# Patient Record
Sex: Female | Born: 1940 | Race: Black or African American | Hispanic: No | Smoking: Former smoker
Health system: Southern US, Community
[De-identification: ages and names within clinical notes are randomized; demographics above are authoritative.]

## PROBLEM LIST (undated history)

## (undated) DIAGNOSIS — T7840XA Allergy, unspecified, initial encounter: Secondary | ICD-10-CM

## (undated) DIAGNOSIS — I10 Essential (primary) hypertension: Secondary | ICD-10-CM

## (undated) DIAGNOSIS — E119 Type 2 diabetes mellitus without complications: Secondary | ICD-10-CM

## (undated) DIAGNOSIS — R32 Unspecified urinary incontinence: Secondary | ICD-10-CM

## (undated) DIAGNOSIS — E7800 Pure hypercholesterolemia, unspecified: Secondary | ICD-10-CM

## (undated) HISTORY — PX: CHOLECYSTECTOMY: SHX55

## (undated) HISTORY — PX: TUBAL LIGATION: SHX77

## (undated) HISTORY — DX: Allergy, unspecified, initial encounter: T78.40XA

## (undated) HISTORY — DX: Unspecified urinary incontinence: R32

---

## 2000-03-06 ENCOUNTER — Other Ambulatory Visit: Admission: RE | Admit: 2000-03-06 | Discharge: 2000-03-06 | Payer: Self-pay | Admitting: Family Medicine

## 2005-02-21 ENCOUNTER — Emergency Department (HOSPITAL_COMMUNITY): Admission: EM | Admit: 2005-02-21 | Discharge: 2005-02-21 | Payer: Self-pay | Admitting: Emergency Medicine

## 2005-08-28 ENCOUNTER — Encounter: Admission: RE | Admit: 2005-08-28 | Discharge: 2005-08-28 | Payer: Self-pay | Admitting: Cardiology

## 2005-09-03 ENCOUNTER — Ambulatory Visit (HOSPITAL_COMMUNITY): Admission: RE | Admit: 2005-09-03 | Discharge: 2005-09-03 | Payer: Self-pay | Admitting: Cardiology

## 2005-09-11 ENCOUNTER — Ambulatory Visit (HOSPITAL_COMMUNITY): Admission: RE | Admit: 2005-09-11 | Discharge: 2005-09-11 | Payer: Self-pay | Admitting: Cardiology

## 2005-09-23 ENCOUNTER — Encounter: Admission: RE | Admit: 2005-09-23 | Discharge: 2005-09-23 | Payer: Self-pay | Admitting: *Deleted

## 2005-09-25 ENCOUNTER — Encounter: Admission: RE | Admit: 2005-09-25 | Discharge: 2005-09-28 | Payer: Self-pay | Admitting: Cardiology

## 2008-01-07 ENCOUNTER — Emergency Department (HOSPITAL_COMMUNITY): Admission: EM | Admit: 2008-01-07 | Discharge: 2008-01-07 | Payer: Self-pay | Admitting: Family Medicine

## 2008-06-26 ENCOUNTER — Encounter: Admission: RE | Admit: 2008-06-26 | Discharge: 2008-06-26 | Payer: Self-pay | Admitting: Cardiology

## 2008-06-27 ENCOUNTER — Encounter: Admission: RE | Admit: 2008-06-27 | Discharge: 2008-06-27 | Payer: Self-pay | Admitting: Cardiology

## 2008-07-11 ENCOUNTER — Encounter: Admission: RE | Admit: 2008-07-11 | Discharge: 2008-07-11 | Payer: Self-pay | Admitting: Cardiology

## 2009-07-20 ENCOUNTER — Encounter: Admission: RE | Admit: 2009-07-20 | Discharge: 2009-07-20 | Payer: Self-pay | Admitting: Internal Medicine

## 2011-03-31 ENCOUNTER — Other Ambulatory Visit: Payer: Self-pay | Admitting: Internal Medicine

## 2011-03-31 DIAGNOSIS — Z1231 Encounter for screening mammogram for malignant neoplasm of breast: Secondary | ICD-10-CM

## 2011-04-01 ENCOUNTER — Emergency Department (HOSPITAL_COMMUNITY): Payer: Medicare Other

## 2011-04-01 ENCOUNTER — Inpatient Hospital Stay (HOSPITAL_COMMUNITY)
Admission: EM | Admit: 2011-04-01 | Discharge: 2011-04-04 | DRG: 287 | Disposition: A | Discharge status: Home / Self Care | Payer: Medicare Other | Attending: Internal Medicine | Admitting: Internal Medicine

## 2011-04-01 DIAGNOSIS — Z87891 Personal history of nicotine dependence: Secondary | ICD-10-CM

## 2011-04-01 DIAGNOSIS — K209 Esophagitis, unspecified without bleeding: Secondary | ICD-10-CM | POA: Diagnosis present

## 2011-04-01 DIAGNOSIS — K449 Diaphragmatic hernia without obstruction or gangrene: Secondary | ICD-10-CM | POA: Diagnosis present

## 2011-04-01 DIAGNOSIS — E785 Hyperlipidemia, unspecified: Secondary | ICD-10-CM | POA: Diagnosis present

## 2011-04-01 DIAGNOSIS — E119 Type 2 diabetes mellitus without complications: Secondary | ICD-10-CM | POA: Diagnosis present

## 2011-04-01 DIAGNOSIS — I10 Essential (primary) hypertension: Secondary | ICD-10-CM | POA: Diagnosis present

## 2011-04-01 DIAGNOSIS — Z7982 Long term (current) use of aspirin: Secondary | ICD-10-CM

## 2011-04-01 DIAGNOSIS — E669 Obesity, unspecified: Secondary | ICD-10-CM | POA: Diagnosis present

## 2011-04-01 DIAGNOSIS — Z882 Allergy status to sulfonamides status: Secondary | ICD-10-CM

## 2011-04-01 DIAGNOSIS — E7800 Pure hypercholesterolemia, unspecified: Secondary | ICD-10-CM | POA: Diagnosis present

## 2011-04-01 DIAGNOSIS — Z880 Allergy status to penicillin: Secondary | ICD-10-CM

## 2011-04-01 DIAGNOSIS — R0789 Other chest pain: Principal | ICD-10-CM | POA: Diagnosis present

## 2011-04-01 DIAGNOSIS — Z79899 Other long term (current) drug therapy: Secondary | ICD-10-CM

## 2011-04-01 LAB — COMPREHENSIVE METABOLIC PANEL
ALT: 13 U/L (ref 0–35)
AST: 17 U/L (ref 0–37)
Alkaline Phosphatase: 59 U/L (ref 39–117)
BUN: 11 mg/dL (ref 6–23)
CO2: 25 mEq/L (ref 19–32)
Calcium: 9.6 mg/dL (ref 8.4–10.5)
Chloride: 103 mEq/L (ref 96–112)
GFR calc Af Amer: >60 mL/min (ref >60)
GFR calc non Af Amer: >60 mL/min (ref >60)
Glucose, Bld: 143 mg/dL — ABNORMAL HIGH (ref 70–99)
Sodium: 139 mEq/L (ref 135–145)
Total Protein: 7.2 g/dL (ref 6.0–8.3)

## 2011-04-01 LAB — CBC
HCT: 36.1 % (ref 36.0–46.0)
Hemoglobin: 13 g/dL (ref 12.0–15.0)
MCH: 26.2 pg (ref 26.0–34.0)
MCHC: 36 g/dL (ref 30.0–36.0)
MCV: 72.8 fL — ABNORMAL LOW (ref 78.0–100.0)
Platelets: 226 10*3/uL (ref 150–400)
RBC: 4.96 MIL/uL (ref 3.87–5.11)
RDW: 14.3 % (ref 11.5–15.5)
WBC: 8.7 10*3/uL (ref 4.0–10.5)

## 2011-04-01 LAB — CK TOTAL AND CKMB (NOT AT ARMC)
CK, MB: 2.3 ng/mL (ref 0.3–4.0)
Total CK: 115 U/L (ref 7–177)

## 2011-04-01 LAB — DIFFERENTIAL
Basophils Absolute: 0.1 10*3/uL (ref 0.0–0.1)
Basophils Relative: 1 % (ref 0–1)
Eosinophils Absolute: 0.2 10*3/uL (ref 0.0–0.7)
Lymphs Abs: 2.5 10*3/uL (ref 0.7–4.0)
Monocytes Absolute: 0.5 10*3/uL (ref 0.1–1.0)
Monocytes Relative: 6 % (ref 3–12)
Neutro Abs: 5.4 10*3/uL (ref 1.7–7.7)

## 2011-04-01 LAB — GLUCOSE, CAPILLARY: Glucose-Capillary: 115 mg/dL — ABNORMAL HIGH (ref 70–99)

## 2011-04-01 LAB — CARDIAC PANEL(CRET KIN+CKTOT+MB+TROPI)
CK, MB: 2.2 ng/mL (ref 0.3–4.0)
Troponin I: <0.3 ng/mL (ref <0.30)

## 2011-04-01 LAB — TROPONIN I: Troponin I: <0.3 ng/mL (ref <0.30)

## 2011-04-01 LAB — PRO B NATRIURETIC PEPTIDE: Pro B Natriuretic peptide (BNP): 43.6 pg/mL (ref 0–125)

## 2011-04-01 LAB — D-DIMER, QUANTITATIVE: D-Dimer, Quant: 0.37 ug/mL-FEU (ref 0.00–0.48)

## 2011-04-02 LAB — LIPID PANEL
LDL Cholesterol: 219 mg/dL — ABNORMAL HIGH (ref 0–99)
Total CHOL/HDL Ratio: 7.7 RATIO
Triglycerides: 180 mg/dL — ABNORMAL HIGH (ref <150)
VLDL: 36 mg/dL (ref 0–40)

## 2011-04-02 LAB — CARDIAC PANEL(CRET KIN+CKTOT+MB+TROPI)
CK, MB: 2.2 ng/mL (ref 0.3–4.0)
Relative Index: INVALID (ref 0.0–2.5)
Total CK: 95 U/L (ref 7–177)
Troponin I: <0.3 ng/mL (ref <0.30)

## 2011-04-02 LAB — TSH: TSH: 2.337 u[IU]/mL (ref 0.350–4.500)

## 2011-04-02 LAB — GLUCOSE, CAPILLARY: Glucose-Capillary: 122 mg/dL — ABNORMAL HIGH (ref 70–99)

## 2011-04-03 ENCOUNTER — Inpatient Hospital Stay (HOSPITAL_COMMUNITY): Payer: Medicare Other

## 2011-04-03 LAB — PROTIME-INR
INR: 0.97 (ref 0.00–1.49)
Prothrombin Time: 13.1 seconds (ref 11.6–15.2)

## 2011-04-03 LAB — GLUCOSE, CAPILLARY
Glucose-Capillary: 100 mg/dL — ABNORMAL HIGH (ref 70–99)
Glucose-Capillary: 100 mg/dL — ABNORMAL HIGH (ref 70–99)

## 2011-04-04 ENCOUNTER — Other Ambulatory Visit (HOSPITAL_COMMUNITY): Payer: Medicare Other

## 2011-04-04 LAB — CBC
MCH: 25.2 pg — ABNORMAL LOW (ref 26.0–34.0)
MCV: 73.1 fL — ABNORMAL LOW (ref 78.0–100.0)
Platelets: 201 10*3/uL (ref 150–400)
RBC: 4.68 MIL/uL (ref 3.87–5.11)
RDW: 14.4 % (ref 11.5–15.5)

## 2011-04-04 LAB — BASIC METABOLIC PANEL
BUN: 11 mg/dL (ref 6–23)
Calcium: 8.8 mg/dL (ref 8.4–10.5)
Chloride: 107 mEq/L (ref 96–112)
Creatinine, Ser: 0.8 mg/dL (ref 0.50–1.10)
GFR calc Af Amer: >60 mL/min (ref >60)
GFR calc non Af Amer: >60 mL/min (ref >60)

## 2011-04-04 LAB — GLUCOSE, CAPILLARY
Glucose-Capillary: 106 mg/dL — ABNORMAL HIGH (ref 70–99)
Glucose-Capillary: 151 mg/dL — ABNORMAL HIGH (ref 70–99)

## 2011-04-10 NOTE — H&P (Signed)
[name], [name]             ACCOUNT NO.:  [id number]  MEDICAL RECORD NO.:  [id number]  LOCATION:  [facility]                         FACILITY:  [facility]  PHYSICIAN:  Jonny Ruiz, MD    DATE OF BIRTH:  09-13-1941  DATE OF ADMISSION:  04/01/2011 DATE OF DISCHARGE:                             HISTORY & PHYSICAL   CHIEF COMPLAINT:  Chest pain.  HISTORY OF PRESENT ILLNESS:  The patient is a 71 year old black female with a past medical history significant for hypertension, hypercholesterolemia, and diabetes on oral agents, who presented to the emergency room today because of chest pain, retrosternal, dull, gradual onset, lasting 10 minutes that occurred at rest while she was trying to sleep last night with some radiation into the left arm and associated to shortness of breath "unease stomach."  The patient states that she could not sleep well last night because of the discomfort.  Today, she is chest pain-free, but she is being very worried about this chest pain because her mom passed from a massive heart attack.  In addition, the patient refers that about a month ago, she felt a tingling and painful sensation in her chest, but it lasted just a few minutes and she ignored it.  Last Friday, she also had another episode of chest pain that lasted 10 minutes while at rest for which she did not seek medical attention. The patient has a sedentary life.  The patient is otherwise asymptomatic.  She denies cough, wheezes, fever, or chills.  PAST MEDICAL HISTORY:  Hiatal hernia, hypertension, diabetes mellitus type 2, obesity, hypercholesterolemia.  PAST SURGICAL HISTORY:  Cholecystectomy and tubal ligation.  MEDICATIONS: 1. Vitamin D2 oral. 2. Janumet 50/1000 b.i.d. 3. Omega-3 once a day. 4. Tribenzor 40/5/12.5 once a day.  ALLERGIES:  SULFA CAUSES HIVES, PENICILLIN CAUSES HIVES.  FAMILY HISTORY:  As is stated above is significant for ischemic heart disease in her mother and her  father had stroke.  SOCIAL HISTORY:  The patient is married, has 6 children.  She is a former smoker.  She quit about 30 years ago.  Denied drinking.  REVIEW OF SYSTEMS:  CONSTITUTIONAL:  Negative for fever, chills, night sweats, fatigue, malaise, or weight loss.  CARDIOVASCULAR:  Denies orthopnea.  Nocturia.  PND or edema.  RESPIRATORY:  Denies cough, wheezes, or hemoptysis.  GI:  Denies heart burn, abdominal pain, nausea, vomiting, diarrhea, or constipation, but again, the patient felt her stomach discomfort last night when she got this chest pain.  GU:  Denies dysuria, frequency, or hematuria.  MUSCULOSKELETAL:  Denies arthralgias, myalgias, or joint swelling.  PHYSICAL EXAMINATION:  VITAL SIGNS:  Blood pressure 161/53, pulse 83 and regular, respirations 20, temp 98.1, saturation 98%. GENERAL APPEARANCE:  The patient is a black female, who appears in no distress, very pleasant, cheerful, and cooperative. HEENT:  Unremarkable. NECK:  Supple without JVD.  No carotid bruits.  No thyromegaly or nodules. CHEST:  Regular S1 and S2 without gallops, murmurs, or rubs. LUNGS:  Clear to auscultation. ABDOMEN:  Obese, soft, and nontender without organomegaly or masses palpable. EXTREMITIES:  Without clubbing, cyanosis, or edema. NEUROLOGICAL:  Nonfocal.  LABORATORY DATA:  Cardiac enzymes negative.  Troponin less than 0.30.  CMP:  Essentially normal.  CBC:  Essentially normal.  EKG:  Normal sinus rhythm at 70 beats per minutes, normal QRS, normal ST and T waves. Chest x-ray:  NAD with chronic degenerative spondylosis and osteophyte formation.  IMPRESSION: 1. Chest pain, rule out myocardial infarction.  The patient does have     cardiovascular risk factors for ischemic heart disease and     therefore deserves admission to the hospital/telemetry unit to be     ruled out and screened for ischemic heart disease in a.m.  She will     be placed on aspirin 81 mg a day, Lipitor 80 mg a day, and      lisinopril 10 mg a day.  I have contacted Cardiology, Dr. Sharyn Lull,     who gladly agreed to see the patient later on today.  The patient     is a full code. 2. Diabetes mellitus type 2.  We will continue her on Janumet 50/1000     p.o. b.i.d. 3. Hypertension, uncontrolled.  We will start lisinopril 10 mg a day     and monitor. 4. Prophylaxis.  Lovenox per protocol.          ______________________________ Jonny Ruiz, MD     GL/MEDQ  D:  04/01/2011  T:  04/01/2011  Job:  [id number]  Electronically Signed by Jonny Ruiz MD on 04/10/2011 04:52:37 PM

## 2011-04-16 NOTE — Cardiovascular Report (Signed)
[name], [name]             ACCOUNT NO.:  [id number]  MEDICAL RECORD NO.:  [id number]  LOCATION:  3706                         FACILITY:  [facility]  PHYSICIAN:  Kristina Bertone N. Sharyn Lull, M.D. DATE OF BIRTH:  [birth year]  DATE OF PROCEDURE:  04/03/2011 DATE OF DISCHARGE:                           CARDIAC CATHETERIZATION   PROCEDURES:  Left cardiac catheterization with selective left and right coronary angiography, left ventriculography via right groin using Judkins technique.  INDICATIONS FOR PROCEDURE:  [name] is a 70 year old black female with past medical history significant for hypertension, non-insulin- dependent diabetes mellitus, hypercholesteremia, and hiatus hernia.  She came to the ER complaining of retrosternal chest pressure radiating to the left arm associated with shortness of breath.  States chest pain was grade 8/10, relieved in 20 minutes last night and again had chest pressure this morning.  She went to PMD's office and was referred to the ER.  States lately also gets exertional chest pain and gets short of breath and fatigued and tired with minimal exertion.  Denies any PND, orthopnea, or leg swelling.  Denies palpitation, lightheadedness, or syncope.  Denies rest or nocturnal angina.  Denies any recent cardiac workup.  Denies relation of chest pain to food, breathing, or movement. States she had catheter more than 20 years ago and was told it was okay.  PAST MEDICAL HISTORY:  As above.  PAST SURGICAL HISTORY:  She had cholecystectomy in the past, had tubal ligation in the past.  ALLERGIES:  She is allergic to SULFA and PENICILLIN.  MEDICATIONS:  At home, she was on Tribenzor, Janumet, vitamin D2, and Omega-3.  SOCIAL HISTORY:  She is married, has 6 children.  Smoked 1 pack per day for 20 years, quit approximately 30 years ago.  No history of alcohol abuse.  She is retired, worked as Architectural technologist in the past.  FAMILY HISTORY:  Father died of  stroke at the age of 28.  Mother died of massive MI at the age of 1.  She has one sister who had congestive heart failure.  PHYSICAL EXAMINATION:  GENERAL:  She was alert, awake, and oriented x3 in no acute distress. VITAL SIGNS:  Blood pressure was 161/53.  Pulse was 83 and regular. HEENT:  Conjunctivae were pink. NECK:  Supple.  No JVD.  No bruit. LUNGS:  Clear to auscultation without rhonchi or rales. CARDIOVASCULAR:  S1 and S2 were normal.  There was soft systolic murmur and S4 gallop. ABDOMEN:  Soft and obese.  Bowel sounds were present.  Nontender. EXTREMITIES:  There was no clubbing, cyanosis, or edema.  The patient was admitted to Telemetry Unit.  MI was ruled out by serial enzymes and EKG.  Discussed with the patient regarding noninvasive stress testing versus left cath, its risks and benefits, i.e., death, MI, stroke, need for emergency CABG, local vascular complications, etc., and consented for PCI.  PROCEDURE:  After obtaining informed consent, the patient was brought to the Cath Lab and was placed on fluoroscopy table.  Right groin was prepped and draped in the usual fashion.  Xylocaine 1% was used for local anesthesia in the right groin.  With the help of thin-wall needle, a 6-French arterial sheath was  placed.  The sheath was aspirated and flushed.  Next, a 6-French left Judkins catheter was advanced over the wire under fluoroscopic guidance up to the ascending aorta.  Wire was pulled out.  The catheter was aspirated and connected to the manifold. Catheter was further advanced and engaged into left coronary ostium. Multiple views of the left system were taken.  Next, catheter was disengaged and was pulled out over the wire and was replaced with 6- [place] right Judkins catheter, which was advanced over the wire under fluoroscopic guidance up to the ascending aorta.  Wire was pulled out. The catheter was aspirated and connected to the manifold.  Catheter was further  advanced and engaged into right coronary ostium.  Multiple views of the right system were taken.  Next, catheter was disengaged and was pulled out over the wire and was replaced with 6-French pigtail catheter, which was advanced over the wire under fluoroscopic guidance up to the ascending aorta.  Wire was pulled out.  The catheter was aspirated and connected to the manifold.  Catheter was further advanced across the aortic valve into the LV.  LV pressures were recorded.  Next, LV-graphy was done in 30-degree RAO position.  Post-angiographic pressures were recorded from LV and then pullback pressures were recorded from the aorta.  There was no significant gradient across the aortic valve.  Next, the pigtail catheter was pulled out over the wire. Sheaths were aspirated and flushed.  LV showed good LV systolic function, mild LVH, EF of 55-60%.  Left main was calcified and patent. LAD was also calcified proximally and has 30-40% mid and 20-30% distal stenosis.  Diagonal 1 had 40-50% ostial and 20-30% proximal stenosis. Diagonal 2 is very small which is patent.  Ramus is small which is patent.  Left circumflex has 10-20% ostial stenosis.  OM-1 is small which is patent.  RCA has 5-10% proximal and mid stenosis.  PDA and PLV branches were small which were patent.  The patient tolerated the procedure well.  There were no complications.  The patient was transferred to the recovery room in stable condition.     Eduardo Osier. Sharyn Lull, M.D.     MNH/MEDQ  D:  04/03/2011  T:  04/03/2011  Job:  [id number]  cc:   [facility]  Electronically Signed by Rinaldo Cloud M.D. on 04/16/2011 11:14:53 PM

## 2011-04-16 NOTE — Discharge Summary (Signed)
[name], [name]             ACCOUNT NO.:  [id number]  MEDICAL RECORD NO.:  [id number]  LOCATION:  3706                         FACILITY:  [facility]  PHYSICIAN:  Thad Ranger, MD       DATE OF BIRTH:  [birth year]  DATE OF ADMISSION:  04/01/2011 DATE OF DISCHARGE:                        DISCHARGE SUMMARY - REFERRING   PRIMARY CARE PHYSICIAN:  Merlene Laughter. Renae Gloss, MD  DISCHARGE DIAGNOSES: 1. Atypical chest pain, resolved.  Cardiac catheterization revealed no     significant obstructive coronary artery disease. 2. Hiatal hernia with possible esophagitis. 3. Hyperlipidemia. 4. Hypertension. 5. Diabetes.  CONSULTATIONS:  Cardiology, Dr. Sharyn Lull.  DISCHARGE MEDICATIONS: 1. Aspirin 81 mg daily. 2. Protonix 40 mg p.o. b.i.d. 3. Simvastatin 20 mg p.o. daily. 4. Janumet 50/1000 mg 1 tablet p.o. b.i.d. 5. Multivitamin 1 tablet p.o. daily. 6. Omega-3 acids 1 capsule p.o. daily. 7. Tribenzor, combination of amlodipine 5 mg, hydrochlorothiazide 12.5     mg, olmesartan 40 mg 1 tablet p.o. daily). 8. Vitamin D2 of 50,000 units 1 capsule p.o. Monday and Thursday.  BRIEF HISTORY OF PRESENT ILLNESS:  At the time of admission, [name] is a 70 year old female with past medical history significant for hypertension, hyperlipidemia, diabetes, presented with chest pain, retrosternal dull, gradual onset lasting 10 minutes that occurred at rest while she was trying to sleep the night before the admission.  The chest pain was also associated with shortness of breath, GI upset, and some radiation into the left arm.  Next morning, the patient was chest pain-free, but was extremely worried that she may have had heart attack and presented to the emergency room.  Radiological data, chest x-ray two- view, April 01, 2011, no cardiopulmonary disease or chronic bony findings, slightly osteopenic appearance of the spine.  Cardiac catheterization, April 03, 2011, showed good LV systolic function, mild  LVH, EF of 55-60%, left main calcified and patent, LAD has 30-40% mid and 20-30% distal stenosis, diagonal 1 has 40-50% ostial and 20-30% proximal stenosis, diagonal 2 small, but patent ramus small and patent left circumflex 10- 20% ostial stenosis, OM1 small patent RCA  5 to 10% proximal and mid stenosis, recommended medical management.  BRIEF HOSPITALIZATION COURSE:  [name] is a 70 year old female, who was admitted with chest pain that has resolved. 1. Chest pain ruled out ACS.  The patient was placed on aspirin, beta-     blocker, statin, and lisinopril.  Cardiology was consulted and the     patient underwent cardiac catheterization, which showed no critical     obstructive coronary artery disease.  Recommended medical     management.  The patient does have a history of hiatal hernia and     was started on PPI, which did improve her chest pain, it is     possible the patient had noncardiac chest pain.  She is cleared     from Cardiology standpoint for the discharge today. 2. Diabetes mellitus.  The patient had HbA1c of 6.7 with a mean plasma     glucose of 146.  She was placed on Tradjenta and sliding scale     insulin. 3. Hyperlipidemia, lipid profile was checked, which showed cholesterol  of 293 with LDL of 219.  The patient states that she has not     tolerated statins in the past, however, at the time of the     admission she was started on low-dose Zocor, which she has     tolerated so far.  She complains of no myalgias or pain. 4. Hypertension, controlled. 5. The patient will be discharged home today.  PHYSICAL EXAMINATION:  VITAL SIGNS:  At the time of the dictation, temperature 98.0, pulse 61, respirations 18, blood pressure 125/69, O2 saturations 99% on room air. GENERAL: The patient is alert, awake, and oriented x3, not in any acute distress. HEENT:  Anicteric sclerae. Clear conjunctivae.  Pupils reactive to light and accommodation.  EOMI. NECK:  Supple.  No  lymphadenopathy.  No JVD. CVS:  S1 and S2 clear. CHEST:  Clear to auscultation bilaterally. ABDOMEN:  Soft, nontender, and nondistended.  Normal bowel sounds. EXTREMITIES:  No cyanosis, clubbing, or edema noted in upper or lower extremities bilaterally.  DISCHARGE FOLLOWUP:  Follow up with Dr. Andi Devon in 7-10 days for hospital follow up.  DISCHARGE TIME:  35 minutes.     Thad Ranger, MD     RR/MEDQ  D:  04/04/2011  T:  04/04/2011  Job:  [id number]  cc:   Merlene Laughter. Renae Gloss, M.D. Eduardo Osier. Sharyn Lull, M.D.  Electronically Signed by Andres Labrum Mikhael Hendriks  on 04/16/2011 05:38:19 PM

## 2011-06-24 ENCOUNTER — Other Ambulatory Visit: Payer: Self-pay | Admitting: Internal Medicine

## 2011-06-24 DIAGNOSIS — N63 Unspecified lump in unspecified breast: Secondary | ICD-10-CM

## 2011-07-03 ENCOUNTER — Ambulatory Visit
Admission: RE | Admit: 2011-07-03 | Discharge: 2011-07-03 | Disposition: A | Discharge status: Home / Self Care | Payer: Medicare Other | Source: Ambulatory Visit | Attending: Internal Medicine | Admitting: Internal Medicine

## 2011-07-03 DIAGNOSIS — N63 Unspecified lump in unspecified breast: Secondary | ICD-10-CM

## 2012-08-19 ENCOUNTER — Emergency Department (INDEPENDENT_AMBULATORY_CARE_PROVIDER_SITE_OTHER)
Admission: EM | Admit: 2012-08-19 | Discharge: 2012-08-19 | Disposition: A | Discharge status: Home / Self Care | Payer: Medicare Other | Source: Home / Self Care | Attending: Emergency Medicine | Admitting: Emergency Medicine

## 2012-08-19 ENCOUNTER — Encounter (HOSPITAL_COMMUNITY): Payer: Self-pay | Admitting: Emergency Medicine

## 2012-08-19 DIAGNOSIS — H8109 Meniere's disease, unspecified ear: Secondary | ICD-10-CM

## 2012-08-19 HISTORY — DX: Type 2 diabetes mellitus without complications: E11.9

## 2012-08-19 HISTORY — DX: Essential (primary) hypertension: I10

## 2012-08-19 LAB — POCT I-STAT, CHEM 8
Calcium, Ion: 1.28 mmol/L (ref 1.13–1.30)
Creatinine, Ser: 1 mg/dL (ref 0.50–1.10)
Glucose, Bld: 113 mg/dL — ABNORMAL HIGH (ref 70–99)
HCT: 42 % (ref 36.0–46.0)
Hemoglobin: 14.3 g/dL (ref 12.0–15.0)
Potassium: 3.9 mEq/L (ref 3.5–5.1)
TCO2: 26 mmol/L (ref 0–100)

## 2012-08-19 MED ORDER — ACETAZOLAMIDE 125 MG PO TABS: 125.0000 mg | ORAL_TABLET | Freq: Two times a day (BID) | ORAL | Status: DC

## 2012-08-19 MED ORDER — PREDNISONE 5 MG PO KIT: 1.0000 | PACK | Freq: Every day | ORAL | Status: DC

## 2012-08-19 NOTE — ED Provider Notes (Signed)
Chief Complaint  Patient presents with  . Tinnitus    x sunday comes and goes, some dizziness worse with bending over. denies n/v    History of Present Illness:  Mrs. [name] is a 71 year old female who presents today with her daughter with a several month history of tinnitus and a 5 day history of dizziness. The tinnitus involves only the right ear, and has been going on for several months. This past Sunday the tinnitus seemed to be getting louder, and she decided to come in today. She has an appointment to see an otolaryngologist on December 3. She also notes diminished hearing in the right ear, but denies any pain or pressure in the ear. The dizziness seemed to get worse at about the same time the tinnitus got louder. She denies any whirling vertigo or presyncope, but states she feels lightheaded. This is positional. It is worse if she looks down or bends over. Episodes of dizziness last for about 15 seconds. She has had a slight headache and numbness in the tips of her fingers at times. She feels unsteady on her feet and staggering. She denies any diplopia or blurred vision. She's had no other paresthesias or localized muscle weakness. No difficulty with speech or swallowing.  Review of Systems:  Other than noted above, the patient denies any of the following symptoms: Systemic:  No fever, chills, fatigue, or weight loss. Eye:  No blurred vision, visual change or diplopia. ENT:  No ear pain, tinnitus, hearing loss, nasal congestion, or rhinorrhea. Cardiac:  No chest pain, dyspnea, palpitations or syncope. Neuro:  No headache, paresthesias, weakness, trouble with speech, coordination or ambulation.  PMFSH:  Past medical history, family history, social history, meds, and allergies were reviewed.  Physical Exam:   Vital signs:  BP 144/80  Pulse 86  Temp 98.6 F (37 C) (Oral)  Resp 20  SpO2 96% General:  Alert, oriented times 3, in no distress. Eye:  PERRL, full EOM, no nystagmus. ENT:   TMs and canals normal.  Nasal mucosa normal.  Pharynx clear. She was able to hear fingers rubbed together in both ears, but can only hear it the ticking watch with the left ear. With whispered word she did better on the right side than the left. Neck:  No adenopathy, tenderness, or mass.  Thyroid normal.  No carotid bruit. Lungs:  Breath sounds clear and equal bilaterally.  No wheezes, rales or rhonchi. Heart:  Regular rhythm.  No gallops, murmers, or rubs. Neuro:  Alert and oriented times 3.  Cranial nerves intact.  No pronator drift.  Finger to nose normal.  No focal weakness.  Sensation intact to light touch.  Romberg's sign negative, gait normal.  Able to do tandem gait well.  Dix-Hallpike maneuver was negative with both ears down.  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  Given her a try of symptoms of tinnitus, dizziness, and diminished hearing, I think that Mnire's disease is the most likely diagnosis. I have urged her to followup with her ear nose and throat specialist and we'll go ahead and start on Diamox and prednisone.  Plan:   1.  The following meds were prescribed:   New Prescriptions   ACETAZOLAMIDE (DIAMOX) 125 MG TABLET    Take 1 tablet (125 mg total) by mouth 2 (two) times daily.   PREDNISONE 5 MG KIT    Take 1 kit (5 mg total) by mouth daily after breakfast. Prednisone 5 mg 6 day dosepack.  Take as directed.   2.  The patient was instructed in symptomatic care and handouts were given. She should keep  the appointment with ear nose and throat specialist. 3.  The patient was told to return if becoming worse in any way, if no better in 3 or 4 days, and given some red flag symptoms that would indicate earlier return.    Reuben Likes, MD 08/19/12 1240

## 2012-08-19 NOTE — ED Notes (Signed)
Orthostatic BP lying 145/66 sitting 142/78 standing 140/73 mw,cma

## 2012-08-19 NOTE — ED Notes (Signed)
Pt c/o ringing in ears since Sunday. Ringing comes and goes. Some dizziness,  worse with bending over. Pt denies n/v

## 2012-08-20 ENCOUNTER — Observation Stay (HOSPITAL_COMMUNITY): Payer: Medicare Other

## 2012-08-20 ENCOUNTER — Observation Stay (HOSPITAL_COMMUNITY)
Admission: EM | Admit: 2012-08-20 | Discharge: 2012-08-20 | Disposition: A | Discharge status: Home / Self Care | Payer: Medicare Other | Attending: Cardiovascular Disease | Admitting: Cardiovascular Disease

## 2012-08-20 ENCOUNTER — Emergency Department (HOSPITAL_COMMUNITY): Payer: Medicare Other

## 2012-08-20 ENCOUNTER — Encounter (HOSPITAL_COMMUNITY): Payer: Self-pay | Admitting: *Deleted

## 2012-08-20 DIAGNOSIS — E785 Hyperlipidemia, unspecified: Secondary | ICD-10-CM | POA: Insufficient documentation

## 2012-08-20 DIAGNOSIS — I2510 Atherosclerotic heart disease of native coronary artery without angina pectoris: Secondary | ICD-10-CM | POA: Insufficient documentation

## 2012-08-20 DIAGNOSIS — E119 Type 2 diabetes mellitus without complications: Secondary | ICD-10-CM | POA: Insufficient documentation

## 2012-08-20 DIAGNOSIS — R109 Unspecified abdominal pain: Secondary | ICD-10-CM | POA: Insufficient documentation

## 2012-08-20 DIAGNOSIS — I10 Essential (primary) hypertension: Secondary | ICD-10-CM | POA: Insufficient documentation

## 2012-08-20 DIAGNOSIS — R079 Chest pain, unspecified: Principal | ICD-10-CM | POA: Insufficient documentation

## 2012-08-20 HISTORY — DX: Pure hypercholesterolemia, unspecified: E78.00

## 2012-08-20 LAB — PROTIME-INR
INR: 0.91 (ref 0.00–1.49)
Prothrombin Time: 12.2 seconds (ref 11.6–15.2)

## 2012-08-20 LAB — POCT I-STAT, CHEM 8
Creatinine, Ser: 1 mg/dL (ref 0.50–1.10)
Glucose, Bld: 133 mg/dL — ABNORMAL HIGH (ref 70–99)
HCT: 40 % (ref 36.0–46.0)
Hemoglobin: 13.6 g/dL (ref 12.0–15.0)
Potassium: 3.7 mEq/L (ref 3.5–5.1)
Sodium: 142 mEq/L (ref 135–145)
TCO2: 24 mmol/L (ref 0–100)

## 2012-08-20 LAB — CBC
HCT: 37.6 % (ref 36.0–46.0)
Hemoglobin: 13.1 g/dL (ref 12.0–15.0)
MCH: 25.5 pg — ABNORMAL LOW (ref 26.0–34.0)
MCHC: 34.8 g/dL (ref 30.0–36.0)
RDW: 14.2 % (ref 11.5–15.5)

## 2012-08-20 LAB — APTT: aPTT: 34 seconds (ref 24–37)

## 2012-08-20 MED ORDER — ONDANSETRON HCL 4 MG/2ML IJ SOLN: 4.0000 mg | Freq: Four times a day (QID) | INTRAMUSCULAR | Status: DC | PRN

## 2012-08-20 MED ORDER — CLOPIDOGREL BISULFATE 75 MG PO TABS: 75.0000 mg | ORAL_TABLET | Freq: Every day | ORAL | Status: DC

## 2012-08-20 MED ORDER — ASPIRIN EC 81 MG PO TBEC: 81.0000 mg | DELAYED_RELEASE_TABLET | Freq: Every day | ORAL | Status: DC

## 2012-08-20 MED ORDER — TECHNETIUM TC 99M SESTAMIBI GENERIC - CARDIOLITE
10.0000 | Freq: Once | INTRAVENOUS | Status: AC | PRN
  Administered 2012-08-20: 10 via INTRAVENOUS

## 2012-08-20 MED ORDER — METOPROLOL TARTRATE 25 MG PO TABS
12.5000 mg | ORAL_TABLET | Freq: Two times a day (BID) | ORAL | Status: DC
  Filled 2012-08-20 (×2): qty 0.5

## 2012-08-20 MED ORDER — ASPIRIN 300 MG RE SUPP: 300.0000 mg | RECTAL | Status: AC

## 2012-08-20 MED ORDER — ACETAMINOPHEN 325 MG PO TABS: 650.0000 mg | ORAL_TABLET | ORAL | Status: DC | PRN

## 2012-08-20 MED ORDER — REGADENOSON 0.4 MG/5ML IV SOLN
0.4000 mg | Freq: Once | INTRAVENOUS | Status: AC
  Administered 2012-08-20: 0.4 mg via INTRAVENOUS
  Filled 2012-08-20: qty 5

## 2012-08-20 MED ORDER — METOPROLOL TARTRATE 12.5 MG HALF TABLET: 12.5000 mg | ORAL_TABLET | Freq: Two times a day (BID) | ORAL | Status: DC

## 2012-08-20 MED ORDER — TECHNETIUM TC 99M SESTAMIBI GENERIC - CARDIOLITE
30.0000 | Freq: Once | INTRAVENOUS | Status: AC | PRN
  Administered 2012-08-20: 30 via INTRAVENOUS

## 2012-08-20 MED ORDER — ASPIRIN 81 MG PO CHEW
CHEWABLE_TABLET | ORAL | Status: AC
  Filled 2012-08-20: qty 3

## 2012-08-20 MED ORDER — ASPIRIN 81 MG PO CHEW
324.0000 mg | CHEWABLE_TABLET | ORAL | Status: AC
  Administered 2012-08-20: 243 mg via ORAL

## 2012-08-20 MED ORDER — NITROGLYCERIN 0.4 MG SL SUBL
0.4000 mg | SUBLINGUAL_TABLET | SUBLINGUAL | Status: DC | PRN
  Filled 2012-08-20: qty 75

## 2012-08-20 MED ORDER — REGADENOSON 0.4 MG/5ML IV SOLN: 0.4000 mg | Freq: Once | INTRAVENOUS | Status: DC

## 2012-08-20 MED ORDER — SODIUM CHLORIDE 0.9 % IV SOLN: 250.0000 mL | INTRAVENOUS | Status: DC | PRN

## 2012-08-20 MED ORDER — NITROGLYCERIN 0.4 MG SL SUBL: 0.4000 mg | SUBLINGUAL_TABLET | SUBLINGUAL | Status: DC | PRN

## 2012-08-20 MED ORDER — HEPARIN (PORCINE) IN NACL 100-0.45 UNIT/ML-% IJ SOLN
1100.0000 [IU]/h | INTRAMUSCULAR | Status: DC
  Administered 2012-08-20: 1100 [IU]/h via INTRAVENOUS
  Filled 2012-08-20 (×2): qty 250

## 2012-08-20 MED ORDER — HEPARIN BOLUS VIA INFUSION
4000.0000 [IU] | Freq: Once | INTRAVENOUS | Status: AC
  Administered 2012-08-20: 4000 [IU] via INTRAVENOUS

## 2012-08-20 MED ORDER — REGADENOSON 0.4 MG/5ML IV SOLN
INTRAVENOUS | Status: AC
  Filled 2012-08-20: qty 5

## 2012-08-20 MED ORDER — ATORVASTATIN CALCIUM 20 MG PO TABS
20.0000 mg | ORAL_TABLET | Freq: Every day | ORAL | Status: DC
  Filled 2012-08-20: qty 1

## 2012-08-20 MED ORDER — SODIUM CHLORIDE 0.9 % IJ SOLN: 3.0000 mL | INTRAMUSCULAR | Status: DC | PRN

## 2012-08-20 MED ORDER — ASPIRIN 81 MG PO CHEW
81.0000 mg | CHEWABLE_TABLET | Freq: Once | ORAL | Status: AC
  Administered 2012-08-20: 81 mg via ORAL
  Filled 2012-08-20: qty 1

## 2012-08-20 MED ORDER — ASPIRIN 81 MG PO TBEC: 81.0000 mg | DELAYED_RELEASE_TABLET | Freq: Every day | ORAL | Status: DC

## 2012-08-20 MED ORDER — SODIUM CHLORIDE 0.9 % IJ SOLN: 3.0000 mL | Freq: Two times a day (BID) | INTRAMUSCULAR | Status: DC

## 2012-08-20 NOTE — Discharge Summary (Signed)
Physician Discharge Summary  Patient ID: [name] MRN: [id number] DOB/AGE: [birth year] 71 y.o.  Admit date: 08/20/2012 Discharge date: 08/20/2012  Admission Diagnoses: Chest pain r/o MI  Hypertension  Hyperlipidemia  DM, II  Discharge Diagnoses:  Principal diagnosis- *Chest pain* CAD Hypertension  Hyperlipidemia  DM, II  Discharged Condition: good  Hospital Course: 71 years old female with recurrent dull, left precordial, non-radiating chest pain x 5 days had no ischemia on her nuclear stress test. Patient has past history of hypertension, hypercholesterolemia, and diabetes. Her medications are adjusted and discharged home with follow up by Dr. Sharyn Lull in 2-4 weeks.   Consults: None  Significant Diagnostic Studies: labs: Normal CBC, BMET except slightly high blood sugar. Normal Troponin I.  Normal EKG.  Normal Nuclear stress test.  Treatments: cardiac meds: Olmesartan, amlodipine and HCTZ. Add NTG and Metoprolol.  Discharge Exam: Blood pressure 178/37, pulse 69, temperature 98.1 F (36.7 C), temperature source Oral, resp. rate 14, height 5\' 4"  (1.626 m), weight 91.627 kg (202 lb), SpO2 98.00%. GENERAL APPEARANCE: Well built and nourished. No distress, and cooperative.  HEENT: [place]/AT, Brown eyes, conj-pink, Sclera-white. Wears glasses.  NECK: Supple without JVD. No carotid bruits. No thyromegaly.  HEART: Regular S1 and S2 without gallops, murmurs, or rubs.  LUNGS: Clear to auscultation, bilaterally.  ABDOMEN: Obese, soft, and nontender without organomegaly or masses palpable.  EXTREMITIES: Without clubbing, cyanosis, or edema.  NEUROLOGICAL: Moves all 4 extremities.  Skin:- Warm and dry.   Disposition: 01-Home or Self Care     Medication List     As of 08/20/2012  3:27 PM    TAKE these medications         aspirin 81 MG EC tablet   Take 1 tablet (81 mg total) by mouth daily.      calcium carbonate 1250 MG tablet   Commonly known as: OS-CAL - dosed in  mg of elemental calcium   Take 2-3 tablets by mouth 2 (two) times daily. Takes 3  tablets in the morning and 2 tablets in the evening      metFORMIN 1000 MG tablet   Commonly known as: GLUCOPHAGE   Take 1,000 mg by mouth 2 (two) times daily with a meal.      metoprolol tartrate 12.5 mg Tabs   Commonly known as: LOPRESSOR   Take 0.5 tablets (12.5 mg total) by mouth 2 (two) times daily.      multivitamin with minerals Tabs   Take 1 tablet by mouth daily.      PredniSONE 5 MG Kit   Take 1 kit by mouth daily after breakfast. Prednisone 5 mg 6 day dosepack.  Take as directed.      TRIBENZOR 40-5-12.5 MG Tabs   Generic drug: Olmesartan-Amlodipine-HCTZ   Take 1 tablet by mouth daily.      vitamin E 400 UNIT capsule   Take 400 Units by mouth daily.         SignedOrpah Cobb S 08/20/2012, 3:27 PM

## 2012-08-20 NOTE — ED Notes (Signed)
Pt to ED c/o chest pressure, sob and epigastric pain that began last weekend.  Pt states s/s are getting progressively worse, so she came in tonight.  States she has been seen here before for same s/s but does not think it was cardiac.

## 2012-08-20 NOTE — ED Provider Notes (Signed)
History     CSN: [id number]  Arrival date & time 08/20/12  8295   None     Chief Complaint  Patient presents with  . Chest Pain  . Abdominal Pain    (Consider location/radiation/quality/duration/timing/severity/associated sxs/prior treatment) Patient is a 71 y.o. female presenting with chest pain and abdominal pain. The history is provided by the patient.  Chest Pain The chest pain began 3 - 5 hours ago. Duration of episode(s) is 1 hour. Chest pain occurs constantly. The chest pain is unchanged. The pain is associated with exertion. At its most intense, the pain is at 3/10. The pain is currently at 3/10. The severity of the pain is mild. The quality of the pain is described as aching. The pain does not radiate. Primary symptoms include abdominal pain. She tried nothing for the symptoms. Risk factors include being elderly.  Her past medical history is significant for CAD, diabetes and hypertension.  Procedure history is positive for cardiac catheterization.    Abdominal Pain The primary symptoms of the illness include abdominal pain.  Significant associated medical issues include diabetes.    Past Medical History  Diagnosis Date  . Diabetes mellitus without complication   . Hypertension   . Hypercholesteremia     Past Surgical History  Procedure Date  . Cholecystectomy   . Tubal ligation     Family History  Problem Relation Age of Onset  . Diabetes Other   . Hypertension Other     History  Substance Use Topics  . Smoking status: Never Smoker   . Smokeless tobacco: Not on file  . Alcohol Use: No    OB History    Grav Para Term Preterm Abortions TAB SAB Ect Mult Living                  Review of Systems  Cardiovascular: Positive for chest pain.  Gastrointestinal: Positive for abdominal pain.  All other systems reviewed and are negative.    Allergies  Sulfa antibiotics and Penicillins  Home Medications   Current Outpatient Rx  Name  Route  Sig   Dispense  Refill  . PREDNISONE 5 MG PO KIT   Oral   Take 1 kit (5 mg total) by mouth daily after breakfast. Prednisone 5 mg 6 day dosepack.  Take as directed.   1 kit   0     BP 174/65  Pulse 84  Temp 98.4 F (36.9 C) (Oral)  Resp 20  Ht 5\' 5"  (1.651 m)  Wt 210 lb (95.255 kg)  BMI 34.95 kg/m2  SpO2 98%  Physical Exam  Constitutional: She is oriented to person, place, and time. She appears well-developed and well-nourished.  HENT:  Head: Normocephalic and atraumatic.  Eyes: Conjunctivae normal and EOM are normal. Pupils are equal, round, and reactive to light.  Neck: Normal range of motion.  Cardiovascular: Normal rate, regular rhythm and normal heart sounds.   Pulmonary/Chest: Effort normal and breath sounds normal.  Abdominal: Soft. Bowel sounds are normal.  Musculoskeletal: Normal range of motion.  Neurological: She is alert and oriented to person, place, and time.  Skin: Skin is warm and dry.  Psychiatric: She has a normal mood and affect. Her behavior is normal.    ED Course  Procedures (including critical care time)   Labs Reviewed  CBC   No results found.   No diagnosis found.    Date: 08/20/2012  Rate: 87  Rhythm: normal sinus rhythm  QRS Axis: normal  Intervals:  normal  ST/T Wave abnormalities: normal  Conduction Disutrbances: none  Narrative Interpretation: unremarkable    MDM  + chest pain.  Will labs, xray,  reassess Discussed with on call for harwani,  Will see in ed       Rosanne Ashing, MD 08/20/12 [phone]

## 2012-08-20 NOTE — H&P (Signed)
[name] is an 71 y.o. female.   Chief Complaint: Chest pain HPI: 71 years old female with recurrent dull, left precordial, non-radiating chest pain x 5 days. Patient has past history of hypertension, hypercholesterolemia, and diabetes.   Past Medical History  Diagnosis Date  . Diabetes mellitus without complication   . Hypertension   . Hypercholesteremia       Past Surgical History  Procedure Date  . Cholecystectomy   . Tubal ligation     Family History  Problem Relation Age of Onset  . Diabetes Other   . Hypertension Other    Social History:  reports that she has never smoked. She does not have any smokeless tobacco history on file. She reports that she does not drink alcohol or use illicit drugs.  Allergies:  Allergies  Allergen Reactions  . Sulfa Antibiotics   . Penicillins Hives, Swelling and Rash     (Not in a hospital admission)  Results for orders placed during the hospital encounter of 08/20/12 (from the past 48 hour(s))  CBC     Status: Abnormal   Collection Time   08/20/12  4:04 AM      Component Value Range Comment   WBC 10.2  4.0 - 10.5 K/uL    RBC 5.13 (*) 3.87 - 5.11 MIL/uL    Hemoglobin 13.1  12.0 - 15.0 g/dL    HCT 40.9  81.1 - 91.4 %    MCV 73.3 (*) 78.0 - 100.0 fL    MCH 25.5 (*) 26.0 - 34.0 pg    MCHC 34.8  30.0 - 36.0 g/dL    RDW 78.2  95.6 - 21.3 %    Platelets 243  150 - 400 K/uL   POCT I-STAT, CHEM 8     Status: Abnormal   Collection Time   08/20/12  5:34 AM      Component Value Range Comment   Sodium 142  135 - 145 mEq/L    Potassium 3.7  3.5 - 5.1 mEq/L    Chloride 104  96 - 112 mEq/L    BUN 14  6 - 23 mg/dL    Creatinine, Ser 0.86  0.50 - 1.10 mg/dL    Glucose, Bld 578 (*) 70 - 99 mg/dL    Calcium, Ion 4.69  6.29 - 1.30 mmol/L    TCO2 24  0 - 100 mmol/L    Hemoglobin 13.6  12.0 - 15.0 g/dL    HCT 52.8  41.3 - 24.4 %   POCT I-STAT TROPONIN I     Status: Normal   Collection Time   08/20/12  5:37 AM      Component Value  Range Comment   Troponin i, poc 0.01  0.00 - 0.08 ng/mL    Comment 3            GLUCOSE, CAPILLARY     Status: Abnormal   Collection Time   08/20/12  6:49 AM      Component Value Range Comment   Glucose-Capillary 111 (*) 70 - 99 mg/dL    Comment 1 Notify RN      Dg Chest 2 View  08/20/2012  *RADIOLOGY REPORT*  Clinical Data: Chest pain  CHEST - 2 VIEW  Comparison: 04/01/2011  Findings: Mild left lung base linear opacity, likely atelectasis or scarring.  Cardiomediastinal contours are unchanged, within normal limits.  No pleural effusion or pneumothorax.  Multilevel degenerative change.  No acute osseous finding.  IMPRESSION: No radiographic evidence of acute cardiopulmonary  process.   Original Report Authenticated By: Jearld Lesch, M.D.     @ROS @ CONSTITUTIONAL: Negative for fever, chills, night sweats, fatigue, malaise, or weight loss. CARDIOVASCULAR: Denies orthopnea. Nocturia. PND or edema.  RESPIRATORY: Denies cough, wheezes, or hemoptysis.  GI: Denies heart burn, abdominal pain, nausea, vomiting, diarrhea, or constipation.  GU: Denies dysuria, frequency, or hematuria.  MUSCULOSKELETAL: Denies arthralgias, myalgias, or joint swelling.  Blood pressure 123/51, pulse 74, temperature 98.4 F (36.9 C), temperature source Oral, resp. rate 13, height 5\' 5"  (1.651 m), weight 95.255 kg (210 lb), SpO2 96.00%.  GENERAL APPEARANCE: Well built and nourished. No distress, and cooperative.  HEENT: [place]/AT, Brown eyes, conj-pink, Sclera-white.Marland Kitchen  NECK: Supple without JVD. No carotid bruits. No thyromegaly.  HEART: Regular S1 and S2 without gallops, murmurs, or rubs.  LUNGS: Clear to auscultation, bilaterally.  ABDOMEN: Obese, soft, and nontender without organomegaly or masses palpable.  EXTREMITIES: Without clubbing, cyanosis, or edema.  NEUROLOGICAL: Moves all 4 extremities. Skin:- Warm and dry.  Assessment/Plan Chest pain r/o MI Hypertension Hyperlipidemia DM, II  R/O MI/CAD Nuclear  stress test v/s cardiac cath.  Jetaun Colbath S 08/20/2012, 7:11 AM

## 2012-08-20 NOTE — Progress Notes (Signed)
Utilization review completed.  

## 2012-08-20 NOTE — ED Notes (Signed)
TRANSPORTED TO X-RAY. 

## 2012-08-20 NOTE — Progress Notes (Signed)
ANTICOAGULATION CONSULT NOTE - Initial Consult  Pharmacy Consult for heparin Indication: chest pain/ACS  Allergies  Allergen Reactions  . Sulfa Antibiotics   . Penicillins Hives, Swelling and Rash    Patient Measurements: Height: 5\' 5"  (165.1 cm) Weight: 210 lb (95.255 kg) IBW/kg (Calculated) : 57  Heparin Dosing Weight: 81kg  Vital Signs: Temp: 98.4 F (36.9 C) (11/22 0455) Temp src: Oral (11/22 0455) BP: 123/51 mmHg (11/22 0600) Pulse Rate: 74  (11/22 0600)  Labs:  Basename 08/20/12 0534 08/20/12 0404 08/19/12 1137  HGB 13.6 13.1 --  HCT 40.0 37.6 42.0  PLT -- 243 --  APTT -- -- --  LABPROT -- -- --  INR -- -- --  HEPARINUNFRC -- -- --  CREATININE 1.00 -- 1.00  CKTOTAL -- -- --  CKMB -- -- --  TROPONINI -- -- --    Estimated Creatinine Clearance: 60.6 ml/min (by C-G formula based on Cr of 1).   Medical History: Past Medical History  Diagnosis Date  . Diabetes mellitus without complication   . Hypertension   . Hypercholesteremia      Assessment: 71yo female c/o aching CP x3-5hr, worsens with exertion, associated with abdominal pain, to begin heparin given PMH.  Goal of Therapy:  Heparin level 0.3-0.7 units/ml Monitor platelets by anticoagulation protocol: Yes   Plan:  Will give heparin bolus of 4000 units x1 followed by gtt at 1100 units/hr and monitor heparin levels and CBC.  Colleen Can PharmD BCPS 08/20/2012,7:12 AM

## 2012-10-28 ENCOUNTER — Other Ambulatory Visit: Payer: Self-pay | Admitting: Internal Medicine

## 2012-10-28 DIAGNOSIS — Z1231 Encounter for screening mammogram for malignant neoplasm of breast: Secondary | ICD-10-CM

## 2012-11-22 ENCOUNTER — Ambulatory Visit: Payer: Medicare Other

## 2012-12-14 ENCOUNTER — Ambulatory Visit
Admission: RE | Admit: 2012-12-14 | Discharge: 2012-12-14 | Disposition: A | Discharge status: Home / Self Care | Payer: Medicare PPO | Source: Ambulatory Visit | Attending: Internal Medicine | Admitting: Internal Medicine

## 2012-12-14 DIAGNOSIS — Z1231 Encounter for screening mammogram for malignant neoplasm of breast: Secondary | ICD-10-CM

## 2014-08-23 ENCOUNTER — Other Ambulatory Visit (HOSPITAL_COMMUNITY): Payer: Self-pay | Admitting: Cardiology

## 2014-08-23 DIAGNOSIS — R0789 Other chest pain: Secondary | ICD-10-CM

## 2014-09-04 ENCOUNTER — Encounter (HOSPITAL_COMMUNITY)
Admission: RE | Admit: 2014-09-04 | Discharge: 2014-09-04 | Disposition: A | Discharge status: Home / Self Care | Payer: Medicare PPO | Source: Ambulatory Visit | Attending: Cardiology | Admitting: Cardiology

## 2014-09-04 VITALS — BP 166/57 | HR 83

## 2014-09-04 DIAGNOSIS — R0789 Other chest pain: Secondary | ICD-10-CM | POA: Diagnosis not present

## 2014-09-04 MED ORDER — REGADENOSON 0.4 MG/5ML IV SOLN
0.4000 mg | Freq: Once | INTRAVENOUS | Status: AC
  Administered 2014-09-04: 0.4 mg via INTRAVENOUS

## 2014-09-04 MED ORDER — REGADENOSON 0.4 MG/5ML IV SOLN
INTRAVENOUS | Status: AC
  Filled 2014-09-04: qty 5

## 2014-09-04 MED ORDER — TECHNETIUM TC 99M SESTAMIBI GENERIC - CARDIOLITE
30.0000 | Freq: Once | INTRAVENOUS | Status: AC | PRN
  Administered 2014-09-04: 30 via INTRAVENOUS

## 2014-09-04 MED ORDER — TECHNETIUM TC 99M SESTAMIBI GENERIC - CARDIOLITE
10.0000 | Freq: Once | INTRAVENOUS | Status: AC | PRN
  Administered 2014-09-04: 10 via INTRAVENOUS

## 2014-11-08 ENCOUNTER — Emergency Department (HOSPITAL_COMMUNITY)
Admission: EM | Admit: 2014-11-08 | Discharge: 2014-11-08 | Disposition: A | Discharge status: Home / Self Care | Payer: Medicare PPO | Source: Home / Self Care | Attending: Family Medicine | Admitting: Family Medicine

## 2014-11-08 ENCOUNTER — Emergency Department (HOSPITAL_COMMUNITY): Payer: Medicare PPO

## 2014-11-08 ENCOUNTER — Emergency Department (INDEPENDENT_AMBULATORY_CARE_PROVIDER_SITE_OTHER): Payer: Medicare PPO

## 2014-11-08 ENCOUNTER — Encounter (HOSPITAL_COMMUNITY): Payer: Self-pay | Admitting: Emergency Medicine

## 2014-11-08 DIAGNOSIS — J40 Bronchitis, not specified as acute or chronic: Secondary | ICD-10-CM

## 2014-11-08 MED ORDER — GUAIFENESIN-CODEINE 100-10 MG/5ML PO SOLN: 5.0000 mL | Freq: Every evening | ORAL | Status: DC | PRN

## 2014-11-08 MED ORDER — ALBUTEROL SULFATE HFA 108 (90 BASE) MCG/ACT IN AERS: 2.0000 | INHALATION_SPRAY | Freq: Four times a day (QID) | RESPIRATORY_TRACT | Status: DC | PRN

## 2014-11-08 MED ORDER — IPRATROPIUM BROMIDE 0.02 % IN SOLN
RESPIRATORY_TRACT | Status: AC
  Filled 2014-11-08: qty 2.5

## 2014-11-08 MED ORDER — ALBUTEROL SULFATE (2.5 MG/3ML) 0.083% IN NEBU
INHALATION_SOLUTION | RESPIRATORY_TRACT | Status: AC
  Filled 2014-11-08: qty 3

## 2014-11-08 MED ORDER — IPRATROPIUM-ALBUTEROL 0.5-2.5 (3) MG/3ML IN SOLN
3.0000 mL | Freq: Once | RESPIRATORY_TRACT | Status: AC
  Administered 2014-11-08: 3 mL via RESPIRATORY_TRACT

## 2014-11-08 MED ORDER — PREDNISONE 10 MG PO TABS: 30.0000 mg | ORAL_TABLET | Freq: Every day | ORAL | Status: DC

## 2014-11-08 NOTE — ED Notes (Signed)
Went to get patient for CXR, patient was receiving a breathing treatment

## 2014-11-08 NOTE — ED Notes (Signed)
Pt was seen by her cardiologist on Monday and was prescribed a Z-Pack.  Pt was told to come back for a chest xray if she was not feeling better.  She states the Z-Pack has caused "cough spells" and she cannot sleep.

## 2014-11-08 NOTE — ED Provider Notes (Signed)
[name] is a 74 y.o. female who presents to Urgent Care today for cough. Patient has an eight-day history of coughing associated with wheezing. The cough is productive of blood-tinged sputum. She has difficulty sleeping because of the cough. She was seen by her cardiologist 2 days ago and prescribed azithromycin. She is no better. No fevers or chills vomiting or diarrhea.   Past Medical History  Diagnosis Date  . Diabetes mellitus without complication   . Hypertension   . Hypercholesteremia    Past Surgical History  Procedure Laterality Date  . Cholecystectomy    . Tubal ligation     History  Substance Use Topics  . Smoking status: Never Smoker   . Smokeless tobacco: Not on file  . Alcohol Use: No   ROS as above Medications: No current facility-administered medications for this encounter.   Current Outpatient Prescriptions  Medication Sig Dispense Refill  . aspirin EC 81 MG EC tablet Take 1 tablet (81 mg total) by mouth daily.    . calcium carbonate (OS-CAL - DOSED IN MG OF ELEMENTAL CALCIUM) 1250 MG tablet Take 2-3 tablets by mouth 2 (two) times daily. Takes 3  tablets in the morning and 2 tablets in the evening    . metFORMIN (GLUCOPHAGE) 1000 MG tablet Take 1,000 mg by mouth 2 (two) times daily with a meal.    . metoprolol tartrate (LOPRESSOR) 12.5 mg TABS Take 0.5 tablets (12.5 mg total) by mouth 2 (two) times daily. 30 tablet 1  . Multiple Vitamin (MULTIVITAMIN WITH MINERALS) TABS Take 1 tablet by mouth daily.    . Olmesartan-Amlodipine-HCTZ (TRIBENZOR) 40-5-12.5 MG TABS Take 1 tablet by mouth daily.    . vitamin E 400 UNIT capsule Take 400 Units by mouth daily.    Marland Kitchen. albuterol (PROVENTIL HFA;VENTOLIN HFA) 108 (90 BASE) MCG/ACT inhaler Inhale 2 puffs into the lungs every 6 (six) hours as needed for wheezing or shortness of breath. 1 Inhaler 2  . guaiFENesin-codeine 100-10 MG/5ML syrup Take 5 mLs by mouth at bedtime as needed for cough. 120 mL 0  . predniSONE  (DELTASONE) 10 MG tablet Take 3 tablets (30 mg total) by mouth daily. 15 tablet 0   Allergies  Allergen Reactions  . Sulfa Antibiotics   . Penicillins Hives, Swelling and Rash     Exam:  BP 145/65 mmHg  Pulse 63  Temp(Src) 97.8 F (36.6 C) (Oral)  SpO2 99% Gen: Well NAD HEENT: EOMI,  MMM posterior of pharyngeal cobblestoning Lungs: Normal work of breathing. Wheezing bilaterally Heart: RRR no MRG Abd: NABS, Soft. Nondistended, Nontender Exts: Brisk capillary refill, warm and well perfused.   Patient was given a 2.5/0.5 mg DuoNeb nebulizer treatment, and felt better  No results found for this or any previous visit (from the past 24 hour(s)). Dg Chest 2 View  11/08/2014   CLINICAL DATA:  Cough for 2 weeks  EXAM: CHEST  2 VIEW  COMPARISON:  08/20/2012  FINDINGS: Cardiac shadow is within normal limits. The lungs are well aerated bilaterally. No focal infiltrate or sizable effusion is seen. Mild degenerative change in the thoracic spine and acromioclavicular joints is noted.  IMPRESSION: No acute abnormality seen.   Electronically Signed   By: Alcide CleverMark  Lukens M.D.   On: 11/08/2014 12:21    Assessment and Plan: 74 y.o. female with bronchitis. Treat with prednisone and albuterol and codeine cough syrup. Follow-up with PCP.  Discussed warning signs or symptoms. Please see discharge instructions. Patient expresses understanding.  Rodolph Bong, MD 11/08/14 [phone]

## 2014-11-08 NOTE — Discharge Instructions (Signed)
Thank you for coming in today. °Call or go to the emergency room if you get worse, have trouble breathing, have chest pains, or palpitations.  ° °Acute Bronchitis °Bronchitis is inflammation of the airways that extend from the windpipe into the lungs (bronchi). The inflammation often causes mucus to develop. This leads to a cough, which is the most common symptom of bronchitis.  °In acute bronchitis, the condition usually develops suddenly and goes away over time, usually in a couple weeks. Smoking, allergies, and asthma can make bronchitis worse. Repeated episodes of bronchitis may cause further lung problems.  °CAUSES °Acute bronchitis is most often caused by the same virus that causes a cold. The virus can spread from person to person (contagious) through coughing, sneezing, and touching contaminated objects. °SIGNS AND SYMPTOMS  °· Cough.   °· Fever.   °· Coughing up mucus.   °· Body aches.   °· Chest congestion.   °· Chills.   °· Shortness of breath.   °· Sore throat.   °DIAGNOSIS  °Acute bronchitis is usually diagnosed through a physical exam. Your health care provider will also ask you questions about your medical history. Tests, such as chest X-rays, are sometimes done to rule out other conditions.  °TREATMENT  °Acute bronchitis usually goes away in a couple weeks. Oftentimes, no medical treatment is necessary. Medicines are sometimes given for relief of fever or cough. Antibiotic medicines are usually not needed but may be prescribed in certain situations. In some cases, an inhaler may be recommended to help reduce shortness of breath and control the cough. A cool mist vaporizer may also be used to help thin bronchial secretions and make it easier to clear the chest.  °HOME CARE INSTRUCTIONS °· Get plenty of rest.   °· Drink enough fluids to keep your urine clear or pale yellow (unless you have a medical condition that requires fluid restriction). Increasing fluids may help thin your respiratory secretions  (sputum) and reduce chest congestion, and it will prevent dehydration.   °· Take medicines only as directed by your health care provider. °· If you were prescribed an antibiotic medicine, finish it all even if you start to feel better. °· Avoid smoking and secondhand smoke. Exposure to cigarette smoke or irritating chemicals will make bronchitis worse. If you are a smoker, consider using nicotine gum or skin patches to help control withdrawal symptoms. Quitting smoking will help your lungs heal faster.   °· Reduce the chances of another bout of acute bronchitis by washing your hands frequently, avoiding people with cold symptoms, and trying not to touch your hands to your mouth, nose, or eyes.   °· Keep all follow-up visits as directed by your health care provider.   °SEEK MEDICAL CARE IF: °Your symptoms do not improve after 1 week of treatment.  °SEEK IMMEDIATE MEDICAL CARE IF: °· You develop an increased fever or chills.   °· You have chest pain.   °· You have severe shortness of breath. °· You have bloody sputum.   °· You develop dehydration. °· You faint or repeatedly feel like you are going to pass out. °· You develop repeated vomiting. °· You develop a severe headache. °MAKE SURE YOU:  °· Understand these instructions. °· Will watch your condition. °· Will get help right away if you are not doing well or get worse. °Document Released: 10/23/2004 Document Revised: 01/30/2014 Document Reviewed: 03/08/2013 °[employer]® Patient Information ©2015 [employer]. This information is not intended to replace advice given to you by your health care provider. Make sure you discuss any questions you have with your   health care provider. ° °

## 2014-11-18 DIAGNOSIS — E119 Type 2 diabetes mellitus without complications: Secondary | ICD-10-CM | POA: Diagnosis not present

## 2015-03-26 DIAGNOSIS — E669 Obesity, unspecified: Secondary | ICD-10-CM | POA: Diagnosis not present

## 2015-03-26 DIAGNOSIS — E119 Type 2 diabetes mellitus without complications: Secondary | ICD-10-CM | POA: Diagnosis not present

## 2015-03-26 DIAGNOSIS — E785 Hyperlipidemia, unspecified: Secondary | ICD-10-CM | POA: Diagnosis not present

## 2015-03-26 DIAGNOSIS — I2510 Atherosclerotic heart disease of native coronary artery without angina pectoris: Secondary | ICD-10-CM | POA: Diagnosis not present

## 2015-03-26 DIAGNOSIS — I10 Essential (primary) hypertension: Secondary | ICD-10-CM | POA: Diagnosis not present

## 2015-03-26 DIAGNOSIS — K219 Gastro-esophageal reflux disease without esophagitis: Secondary | ICD-10-CM | POA: Diagnosis not present

## 2015-04-26 DIAGNOSIS — N814 Uterovaginal prolapse, unspecified: Secondary | ICD-10-CM | POA: Diagnosis not present

## 2015-04-26 DIAGNOSIS — N8111 Cystocele, midline: Secondary | ICD-10-CM | POA: Diagnosis not present

## 2015-05-02 DIAGNOSIS — N8111 Cystocele, midline: Secondary | ICD-10-CM | POA: Diagnosis not present

## 2015-05-02 DIAGNOSIS — Z124 Encounter for screening for malignant neoplasm of cervix: Secondary | ICD-10-CM | POA: Diagnosis not present

## 2015-05-02 DIAGNOSIS — N814 Uterovaginal prolapse, unspecified: Secondary | ICD-10-CM | POA: Diagnosis not present

## 2015-06-07 ENCOUNTER — Ambulatory Visit: Payer: Medicare PPO | Admitting: Family

## 2015-06-13 DIAGNOSIS — N8111 Cystocele, midline: Secondary | ICD-10-CM | POA: Diagnosis not present

## 2015-06-13 DIAGNOSIS — Z1231 Encounter for screening mammogram for malignant neoplasm of breast: Secondary | ICD-10-CM | POA: Diagnosis not present

## 2015-06-13 LAB — HM MAMMOGRAPHY: HM Mammogram: NEGATIVE

## 2015-06-26 ENCOUNTER — Other Ambulatory Visit (INDEPENDENT_AMBULATORY_CARE_PROVIDER_SITE_OTHER): Payer: Medicare PPO

## 2015-06-26 ENCOUNTER — Ambulatory Visit (INDEPENDENT_AMBULATORY_CARE_PROVIDER_SITE_OTHER): Payer: Medicare PPO | Admitting: Family

## 2015-06-26 ENCOUNTER — Telehealth: Payer: Self-pay | Admitting: Family

## 2015-06-26 ENCOUNTER — Encounter: Payer: Self-pay | Admitting: Family

## 2015-06-26 VITALS — BP 120/68 | HR 68 | Temp 97.8°F | Resp 18 | Ht 65.0 in | Wt 197.0 lb

## 2015-06-26 DIAGNOSIS — I10 Essential (primary) hypertension: Secondary | ICD-10-CM

## 2015-06-26 DIAGNOSIS — M1990 Unspecified osteoarthritis, unspecified site: Secondary | ICD-10-CM | POA: Diagnosis not present

## 2015-06-26 DIAGNOSIS — E119 Type 2 diabetes mellitus without complications: Secondary | ICD-10-CM

## 2015-06-26 LAB — BASIC METABOLIC PANEL
BUN: 12 mg/dL (ref 6–23)
CALCIUM: 9.7 mg/dL (ref 8.4–10.5)
CO2: 28 meq/L (ref 19–32)
CREATININE: 0.98 mg/dL (ref 0.40–1.20)
Chloride: 106 mEq/L (ref 96–112)
GFR: 71.69 mL/min (ref >60.00)
Glucose, Bld: 100 mg/dL — ABNORMAL HIGH (ref 70–99)
Potassium: 4 mEq/L (ref 3.5–5.1)
SODIUM: 142 meq/L (ref 135–145)

## 2015-06-26 LAB — MICROALBUMIN / CREATININE URINE RATIO
Creatinine,U: 190.2 mg/dL
MICROALB UR: 2 mg/dL — AB (ref 0.0–1.9)
MICROALB/CREAT RATIO: 1.1 mg/g (ref 0.0–30.0)

## 2015-06-26 LAB — HEMOGLOBIN A1C: HEMOGLOBIN A1C: 5.8 % (ref 4.6–6.5)

## 2015-06-26 NOTE — Assessment & Plan Note (Signed)
Hypertension appears stable and below goal of 140/90 with current regimen of olmesartan-amlodipine-hydrochlorothiazide. Denies adverse side effects or hypotensive events. Obtain basic metabolic panel to check kidney function. Continue to monitor blood pressure at home. Continue current dosage of olmesartan-amlodipine-hydrochlorothiazide.

## 2015-06-26 NOTE — Assessment & Plan Note (Signed)
Unable to reproduce symptoms of joint popping out as described. Symptoms are consistent with potential arthritis. Continue to monitor at this time and follow up if symptoms are no longer controlled with OTC medications or if dysfunction develops.

## 2015-06-26 NOTE — Patient Instructions (Signed)
Thank you for choosing [facility].  Summary/Instructions:  Your prescription(s) have been submitted to your pharmacy or been printed and provided for you. Please take as directed and contact our office if you believe you are having problem(s) with the medication(s) or have any questions.  Please stop by the lab on the basement level of the building for your blood work. Your results will be released to MyChart (or called to you) after review, usually within 72 hours after test completion. If any changes need to be made, you will be notified at that same time.  If your symptoms worsen or fail to improve, please contact our office for further instruction, or in case of emergency go directly to the emergency room at the closest medical facility.    Pneumococcal Vaccine, Polyvalent suspension for injection What is this medicine? PNEUMOCOCCAL VACCINE, POLYVALENT (NEU mo KOK al vak SEEN, pol ee VEY luhnt) is a vaccine to prevent pneumococcus bacteria infection. These bacteria are a major cause of ear infections, 'Strep throat' infections, and serious pneumonia, meningitis, or blood infections worldwide. These vaccines help the body to produce antibodies (protective substances) that help your body defend against these bacteria. This vaccine is recommended for infants and young children. This vaccine will not treat an infection. This medicine may be used for other purposes; ask your health care Cayle Cordoba or pharmacist if you have questions. COMMON BRAND NAME(S): Prevnar 13 What should I tell my health care Ziana Heyliger before I take this medicine? They need to know if you have any of these conditions: -bleeding problems -fever -immune system problems -low platelet count in the blood -seizures -an unusual or allergic reaction to pneumococcal vaccine, diphtheria toxoid, other vaccines, latex, other medicines, foods, dyes, or preservatives -pregnant or trying to get pregnant -breast-feeding How  should I use this medicine? This vaccine is for injection into a muscle. It is given by a health care professional. A copy of Vaccine Information Statements will be given before each vaccination. Read this sheet carefully each time. The sheet may change frequently. Talk to your pediatrician regarding the use of this medicine in children. While this drug may be prescribed for children as young as 20 weeks old for selected conditions, precautions do apply. Overdosage: If you think you have taken too much of this medicine contact a poison control center or emergency room at once. NOTE: This medicine is only for you. Do not share this medicine with others. What if I miss a dose? It is important not to miss your dose. Call your doctor or health care professional if you are unable to keep an appointment. What may interact with this medicine? -medicines for cancer chemotherapy -medicines that suppress your immune function -medicines that treat or prevent blood clots like warfarin, enoxaparin, and dalteparin -steroid medicines like prednisone or cortisone This list may not describe all possible interactions. Give your health care Tevita Gomer a list of all the medicines, herbs, non-prescription drugs, or dietary supplements you use. Also tell them if you smoke, drink alcohol, or use illegal drugs. Some items may interact with your medicine. What should I watch for while using this medicine? Mild fever and pain should go away in 3 days or less. Report any unusual symptoms to your doctor or health care professional. What side effects may I notice from receiving this medicine? Side effects that you should report to your doctor or health care professional as soon as possible: -allergic reactions like skin rash, itching or hives, swelling of the face, lips,  or tongue -breathing problems -confused -fever over 102 degrees F -pain, tingling, numbness in the hands or feet -seizures -unusual bleeding or  bruising -unusual muscle weakness Side effects that usually do not require medical attention (report to your doctor or health care professional if they continue or are bothersome): -aches and pains -diarrhea -fever of 102 degrees F or less -headache -irritable -loss of appetite -pain, tender at site where injected -trouble sleeping This list may not describe all possible side effects. Call your doctor for medical advice about side effects. You may report side effects to FDA at 1-800-FDA-1088. Where should I keep my medicine? This does not apply. This vaccine is given in a clinic, pharmacy, doctor's office, or other health care setting and will not be stored at home. NOTE: This sheet is a summary. It may not cover all possible information. If you have questions about this medicine, talk to your doctor, pharmacist, or health care Deuntae Kocsis.  2015, Elsevier/Gold Standard. (2008-11-28 10:17:22)  Pravastatin tablets What is this medicine? PRAVASTATIN (PRA va stat in) is known as a HMG-CoA reductase inhibitor or 'statin'. It lowers the level of cholesterol and triglycerides in the blood. This drug may also reduce the risk of heart attack, stroke, or other health problems in patients with risk factors for heart disease. Diet and lifestyle changes are often used with this drug. This medicine may be used for other purposes; ask your health care Peyten Weare or pharmacist if you have questions. COMMON BRAND NAME(S): Pravachol What should I tell my health care Latrelle Bazar before I take this medicine? They need to know if you have any of these conditions: -frequently drink alcoholic beverages -kidney disease -liver disease -muscle aches or weakness -other medical condition -an unusual or allergic reaction to pravastatin, other medicines, foods, dyes, or preservatives -pregnant or trying to get pregnant -breast-feeding How should I use this medicine? Take pravastatin tablets by mouth. Swallow the  tablets with a drink of water. Pravastatin can be taken at anytime of the day, with or without food. Follow the directions on the prescription label. Take your doses at regular intervals. Do not take your medicine more often than directed. Talk to your pediatrician regarding the use of this medicine in children. Special care may be needed. Pravastatin has been used in children as young as 78 years of age. Overdosage: If you think you have taken too much of this medicine contact a poison control center or emergency room at once. NOTE: This medicine is only for you. Do not share this medicine with others. What if I miss a dose? If you miss a dose, take it as soon as you can. If it is almost time for your next dose, take only that dose. Do not take double or extra doses. What may interact with this medicine? Do not take this medicine with any of the following medications: -herbal medicines such as red yeast rice This medicine may also interact with the following medications: -alcohol -antiviral medicines for HIV or AIDS -certain medicines for fungal infections like ketoconazole and itraconazole -colchicine -cyclosporine -other medicines for high cholesterol -some antibiotics like clarithromycin, erythromycin, and telithromycin This list may not describe all possible interactions. Give your health care Lasasha Brophy a list of all the medicines, herbs, non-prescription drugs, or dietary supplements you use. Also tell them if you smoke, drink alcohol, or use illegal drugs. Some items may interact with your medicine. What should I watch for while using this medicine? Visit your doctor or health care professional for  regular check-ups. You may need regular tests to make sure your liver is working properly. Tell your doctor or health care professional right away if you get any unexplained muscle pain, tenderness, or weakness, especially if you also have a fever and tiredness. Your doctor or health care  professional may tell you to stop taking this medicine if you develop muscle problems. If your muscle problems do not go away after stopping this medicine, contact your health care professional. This drug is only part of a total heart-health program. Your doctor or a dietician can suggest a low-cholesterol and low-fat diet to help. Avoid alcohol and smoking, and keep a proper exercise schedule. Do not use this drug if you are pregnant or breast-feeding. Serious side effects to an unborn child or to an infant are possible. Talk to your doctor or pharmacist for more information. This medicine may affect blood sugar levels. If you have diabetes, check with your doctor or health care professional before you change your diet or the dose of your diabetic medicine. If you are going to have surgery tell your health care professional that you are taking this drug. What side effects may I notice from receiving this medicine? Side effects that you should report to your doctor or health care professional as soon as possible: -allergic reactions like skin rash, itching or hives, swelling of the face, lips, or tongue -dark urine -fever -muscle pain, cramps, or weakness -redness, blistering, peeling or loosening of the skin, including inside the mouth -trouble passing urine or change in the amount of urine -unusually weak or tired -yellowing of the eyes or skin Side effects that usually do not require medical attention (report to your doctor or health care professional if they continue or are bothersome): -gas -headache -heartburn -indigestion -stomach pain This list may not describe all possible side effects. Call your doctor for medical advice about side effects. You may report side effects to FDA at 1-800-FDA-1088. Where should I keep my medicine? Keep out of the reach of children. Store at room temperature between 15 to 30 degrees C (59 to 86 degrees F). Protect from light. Keep container tightly  closed. Throw away any unused medicine after the expiration date. NOTE: This sheet is a summary. It may not cover all possible information. If you have questions about this medicine, talk to your doctor, pharmacist, or health care Obie Silos.  2015, Elsevier/Gold Standard. (2011-08-05 10:39:37)  Ezetimibe Tablets What is this medicine? EZETIMIBE (ez ET i mibe) blocks the absorption of cholesterol from the stomach. It can help lower blood cholesterol for patients who are at risk of getting heart disease or a stroke. It is only for patients whose cholesterol level is not controlled by diet. This medicine may be used for other purposes; ask your health care Mirely Pangle or pharmacist if you have questions. COMMON BRAND NAME(S): Zetia What should I tell my health care Nadra Hritz before I take this medicine? They need to know if you have any of these conditions: -liver disease -an unusual or allergic reaction to ezetimibe, medicines, foods, dyes, or preservatives -pregnant or trying to get pregnant -breast-feeding How should I use this medicine? Take this medicine by mouth with a glass of water. Follow the directions on the prescription label. This medicine can be taken with or without food. Take your doses at regular intervals. Do not take your medicine more often than directed. Talk to your pediatrician regarding the use of this medicine in children. Special care may be needed. Overdosage: If  you think you have taken too much of this medicine contact a poison control center or emergency room at once. NOTE: This medicine is only for you. Do not share this medicine with others. What if I miss a dose? If you miss a dose, take it as soon as you can. If it is almost time for your next dose, take only that dose. Do not take double or extra doses. What may interact with this medicine? Do not take this medicine with any of the following medications: -fenofibrate -gemfibrozil This medicine may also interact  with the following medications: -antacids -cyclosporine -herbal medicines like red yeast rice -other medicines to lower cholesterol or triglycerides This list may not describe all possible interactions. Give your health care Sherissa Tenenbaum a list of all the medicines, herbs, non-prescription drugs, or dietary supplements you use. Also tell them if you smoke, drink alcohol, or use illegal drugs. Some items may interact with your medicine. What should I watch for while using this medicine? Visit your doctor or health care professional for regular checks on your progress. You will need to have your cholesterol levels checked. If you are also taking some other cholesterol medicines, you will also need to have tests to make sure your liver is working properly. Tell your doctor or health care professional if you get any unexplained muscle pain, tenderness, or weakness, especially if you also have a fever and tiredness. You need to follow a low-cholesterol, low-fat diet while you are taking this medicine. This will decrease your risk of getting heart and blood vessel disease. Exercising and avoiding alcohol and smoking can also help. Ask your doctor or dietician for advice. What side effects may I notice from receiving this medicine? Side effects that you should report to your doctor or health care professional as soon as possible: -allergic reactions like skin rash, itching or hives, swelling of the face, lips, or tongue -dark yellow or brown urine -unusually weak or tired -yellowing of the skin or eyes Side effects that usually do not require medical attention (report to your doctor or health care professional if they continue or are bothersome): -diarrhea -dizziness -headache -stomach upset or pain This list may not describe all possible side effects. Call your doctor for medical advice about side effects. You may report side effects to FDA at 1-800-FDA-1088. Where should I keep my medicine? Keep out of  the reach of children. Store at room temperature between 15 and 30 degrees C (59 and 86 degrees F). Protect from moisture. Keep container tightly closed. Throw away any unused medicine after the expiration date. NOTE: This sheet is a summary. It may not cover all possible information. If you have questions about this medicine, talk to your doctor, pharmacist, or health care Navjot Loera.  2015, Elsevier/Gold Standard. (2012-03-22 15:39:09)

## 2015-06-26 NOTE — Progress Notes (Signed)
Pre visit review using our clinic review tool, if applicable. No additional management support is needed unless otherwise documented below in the visit note.   Refused both flu and tetanus, does not get the flu shot and wants to see if she got it within the last 10 years

## 2015-06-26 NOTE — Assessment & Plan Note (Signed)
Type 2 diabetes appears well controlled with current regimen of metformin. Denies adverse side effects or hypoglycemic events. Obtain A1c and BMET. Currently monitors blood sugar twice daily at home. Currently maintained on olmsartan for ACE/ARB. Not currently maintained on statin medication, however patient will investigate recommendations to add medication to reduce CAD. Diabetic eye exam up to date. Continue to monitor blood sugar at home. Continue current dosage of metformin pending A1c results.

## 2015-06-26 NOTE — Telephone Encounter (Signed)
Please inform patient that her A1c is 6.0 which indicates excellent control of her blood sugars. Therefore please have her reduce her blood sugar testing to 1-3 times per week. Also her urine and kidney function were normal. Please follow up for a physical at her convenience or in 6 months or sooner if needed.

## 2015-06-26 NOTE — Progress Notes (Signed)
Subjective:    Patient ID: [name], female    DOB: 1941-06-26, 74 y.o.   MRN: [id number]  Chief Complaint  Patient presents with  . Establish Care    having issues with arthritis in her hands, joints popping out of place    HPI:  [name] is a 74 y.o. female who  has a past medical history of Diabetes mellitus without complication; Hypertension; Hypercholesteremia; Allergy; and Urine incontinence. and presents today for an office visit to establish care.   1.) Arthritis - Associated symptoms of arthritis has been going on for about 1 year. Describes that her fingers "go out of joint on occasion." Frequency of popping of her fingers goes on frequently. Located primarily in her third finger.  Modifying factors include ginger tea which has helped with her symptoms on occasion. Does experience occasional pain that is well controlled with over counter medications as needed. Typically she does not need medication. Denies any trauma and notes it occurs when she attempts to use it.    2.) Diabetes - Currently maintained on metformin. Takes the medication as prescribed and denies adverse side effects or hypoglycemia. Takes her blood sugar in the morning and evening. Diabetic eye exam is up to date. Declines Prevnar today. Maintained olmsartan and is not currently on a statin.    Lab Results  Component Value Date   HGBA1C 6.7* 04/01/2011   3.) Hypertension - Currently maintained on Tribenzor. Takes the medication as prescribed and denies adverse side effects or hypotensive events. Currently exercises and watches her salt intake.   BP Readings from Last 3 Encounters:  06/26/15 120/68  11/08/14 145/65  09/04/14 166/57    Allergies  Allergen Reactions  . Sulfa Antibiotics   . Penicillins Hives, Swelling and Rash     Outpatient Prescriptions Prior to Visit  Medication Sig Dispense Refill  . albuterol (PROVENTIL HFA;VENTOLIN HFA) 108 (90 BASE) MCG/ACT inhaler Inhale 2  puffs into the lungs every 6 (six) hours as needed for wheezing or shortness of breath. 1 Inhaler 2  . aspirin EC 81 MG EC tablet Take 1 tablet (81 mg total) by mouth daily.    . calcium carbonate (OS-CAL - DOSED IN MG OF ELEMENTAL CALCIUM) 1250 MG tablet Take 2-3 tablets by mouth 2 (two) times daily. Takes 3  tablets in the morning and 2 tablets in the evening    . metFORMIN (GLUCOPHAGE) 1000 MG tablet Take 1,000 mg by mouth 2 (two) times daily with a meal.    . Multiple Vitamin (MULTIVITAMIN WITH MINERALS) TABS Take 1 tablet by mouth daily.    . Olmesartan-Amlodipine-HCTZ (TRIBENZOR) 40-5-12.5 MG TABS Take 1 tablet by mouth daily.    . vitamin E 400 UNIT capsule Take 400 Units by mouth daily.    Marland Kitchen guaiFENesin-codeine 100-10 MG/5ML syrup Take 5 mLs by mouth at bedtime as needed for cough. 120 mL 0  . metoprolol tartrate (LOPRESSOR) 12.5 mg TABS Take 0.5 tablets (12.5 mg total) by mouth 2 (two) times daily. 30 tablet 1  . predniSONE (DELTASONE) 10 MG tablet Take 3 tablets (30 mg total) by mouth daily. 15 tablet 0   No facility-administered medications prior to visit.     Past Medical History  Diagnosis Date  . Diabetes mellitus without complication   . Hypertension   . Hypercholesteremia   . Allergy   . Urine incontinence      Past Surgical History  Procedure Laterality Date  . Cholecystectomy    .  Tubal ligation       Family History  Problem Relation Age of Onset  . Diabetes Other   . Hypertension Other   . Arthritis Mother   . Hyperlipidemia Mother   . Heart disease Mother   . Hypertension Mother   . Diabetes Mother   . Healthy Father      Social History   Social History  . Marital Status: Widowed    Spouse Name: N/A  . Number of Children: 6  . Years of Education: 12   Occupational History  . Retired    Social History Main Topics  . Smoking status: Never Smoker   . Smokeless tobacco: Never Used  . Alcohol Use: No  . Drug Use: No  . Sexual Activity: Not  on file   Other Topics Concern  . Not on file   Social History Narrative   Denies abuse and feels safe at home.   Denies religious beliefs effecting health care.     Review of Systems  Constitutional: Negative for fever and chills.  Eyes:       Denies changes in vision  Respiratory: Negative for chest tightness.   Cardiovascular: Negative for chest pain, palpitations and leg swelling.  Endocrine: Negative for polydipsia, polyphagia and polyuria.  Musculoskeletal: Positive for arthralgias.  Neurological: Negative for weakness and numbness.      Objective:    BP 120/68 mmHg  Pulse 68  Temp(Src) 97.8 F (36.6 C) (Oral)  Resp 18  Ht  (1.651 m)  Wt 197 lb (89.359 kg)  BMI 32.78 kg/m2  SpO2 98% Nursing note and vital signs reviewed.  Physical Exam  Constitutional: She is oriented to person, place, and time. She appears well-developed and well-nourished. No distress.  Cardiovascular: Normal rate, regular rhythm, normal heart sounds and intact distal pulses.   Pulmonary/Chest: Effort normal and breath sounds normal.  Musculoskeletal:  Right hand - no obvious deformity, discoloration, or edema noted. No tenderness able to be elicited. Decreased finger flexion specifically in the third and fourth fingers secondary to pain. Proximal pulses and capillary refill is intact and appropriate.  Neurological: She is alert and oriented to person, place, and time.  Skin: Skin is warm and dry.  Psychiatric: She has a normal mood and affect. Her behavior is normal. Judgment and thought content normal.       Assessment & Plan:   Problem List Items Addressed This Visit      Cardiovascular and Mediastinum   Essential hypertension    Hypertension appears stable and below goal of 140/90 with current regimen of olmesartan-amlodipine-hydrochlorothiazide. Denies adverse side effects or hypotensive events. Obtain basic metabolic panel to check kidney function. Continue to monitor blood  pressure at home. Continue current dosage of olmesartan-amlodipine-hydrochlorothiazide.      Relevant Orders   Basic Metabolic Panel (BMET) (Completed)     Endocrine   Type 2 diabetes mellitus, controlled - Primary    Type 2 diabetes appears well controlled with current regimen of metformin. Denies adverse side effects or hypoglycemic events. Obtain A1c and BMET. Currently monitors blood sugar twice daily at home. Currently maintained on olmsartan for ACE/ARB. Not currently maintained on statin medication, however patient will investigate recommendations to add medication to reduce CAD. Diabetic eye exam up to date. Continue to monitor blood sugar at home. Continue current dosage of metformin pending A1c results.      Relevant Orders   HgB A1c (Completed)   Microalbumin / creatinine urine ratio (Completed)  Basic Metabolic Panel (BMET) (Completed)     Musculoskeletal and Integument   Arthritis    Unable to reproduce symptoms of joint popping out as described. Symptoms are consistent with potential arthritis. Continue to monitor at this time and follow up if symptoms are no longer controlled with OTC medications or if dysfunction develops.

## 2015-06-27 NOTE — Telephone Encounter (Signed)
Pt aware of results 

## 2016-03-19 ENCOUNTER — Ambulatory Visit (INDEPENDENT_AMBULATORY_CARE_PROVIDER_SITE_OTHER): Payer: Medicare Other | Admitting: Family

## 2016-03-19 ENCOUNTER — Encounter: Payer: Self-pay | Admitting: Family

## 2016-03-19 VITALS — BP 140/82 | HR 69 | Temp 98.0°F | Resp 16 | Ht 65.0 in | Wt 194.2 lb

## 2016-03-19 DIAGNOSIS — M722 Plantar fascial fibromatosis: Secondary | ICD-10-CM | POA: Diagnosis not present

## 2016-03-19 NOTE — Progress Notes (Signed)
Pre visit review using our clinic review tool, if applicable. No additional management support is needed unless otherwise documented below in the visit note. 

## 2016-03-19 NOTE — Patient Instructions (Signed)
Thank you for choosing [facility].  Summary/Instructions:  Ice your foot and ice massage 2-3 times per day and before bed.   Stretches multiple times per day.  Motrin as needed for discomfort.   Wear a good supportive shoe especially when walking long distances.  If your symptoms worsen or fail to improve, please contact our office for further instruction, or in case of emergency go directly to the emergency room at the closest medical facility.   Plantar Fasciitis Plantar fasciitis is a painful foot condition that affects the heel. It occurs when the band of tissue that connects the toes to the heel bone (plantar fascia) becomes irritated. This can happen after exercising too much or doing other repetitive activities (overuse injury). The pain from plantar fasciitis can range from mild irritation to severe pain that makes it difficult for you to walk or move. The pain is usually worse in the morning or after you have been sitting or lying down for a while. CAUSES This condition may be caused by:  Standing for long periods of time.  Wearing shoes that do not fit.  Doing high-impact activities, including running, aerobics, and ballet.  Being overweight.  Having an abnormal way of walking (gait).  Having tight calf muscles.  Having high arches in your feet.  Starting a new athletic activity. SYMPTOMS The main symptom of this condition is heel pain. Other symptoms include:  Pain that gets worse after activity or exercise.  Pain that is worse in the morning or after resting.  Pain that goes away after you walk for a few minutes. DIAGNOSIS This condition may be diagnosed based on your signs and symptoms. Your health care provider will also do a physical exam to check for:  A tender area on the bottom of your foot.  A high arch in your foot.  Pain when you move your foot.  Difficulty moving your foot. You may also need to have imaging studies to confirm the  diagnosis. These can include:  X-rays.  Ultrasound.  MRI. TREATMENT  Treatment for plantar fasciitis depends on the severity of the condition. Your treatment may include:  Rest, ice, and over-the-counter pain medicines to manage your pain.  Exercises to stretch your calves and your plantar fascia.  A splint that holds your foot in a stretched, upward position while you sleep (night splint).  Physical therapy to relieve symptoms and prevent problems in the future.  Cortisone injections to relieve severe pain.  Extracorporeal shock wave therapy (ESWT) to stimulate damaged plantar fascia with electrical impulses. It is often used as a last resort before surgery.  Surgery, if other treatments have not worked after 12 months. HOME CARE INSTRUCTIONS  Take medicines only as directed by your health care provider.  Avoid activities that cause pain.  Roll the bottom of your foot over a bag of ice or a bottle of cold water. Do this for 20 minutes, 3-4 times a day.  Perform simple stretches as directed by your health care provider.  Try wearing athletic shoes with air-sole or gel-sole cushions or soft shoe inserts.  Wear a night splint while sleeping, if directed by your health care provider.  Keep all follow-up appointments with your health care provider. PREVENTION   Do not perform exercises or activities that cause heel pain.  Consider finding low-impact activities if you continue to have problems.  Lose weight if you need to. The best way to prevent plantar fasciitis is to avoid the activities that aggravate  your plantar fascia. SEEK MEDICAL CARE IF:  Your symptoms do not go away after treatment with home care measures.  Your pain gets worse.  Your pain affects your ability to move or do your daily activities.   This information is not intended to replace advice given to you by your health care provider. Make sure you discuss any questions you have with your health care  provider.   Document Released: 06/10/2001 Document Revised: 06/06/2015 Document Reviewed: 07/26/2014 Elsevier Interactive Patient Education [employer]

## 2016-03-19 NOTE — Progress Notes (Signed)
Subjective:    Patient ID: [name], female    DOB: 03/31/1941, 75 y.o.   MRN: [id number]  Chief Complaint  Patient presents with  . Foot Pain    sharp pains that radiates through legs from feet, mainly in right foot but sometimes it happens in the left foot, [place] the [place] the [place]st [place] and said it started then    HPI:  [name] is a 75 y.o. female who  has a past medical history of Diabetes mellitus without complication (HCC); Hypertension; Hypercholesteremia; Allergy; and Urine incontinence. and presents today for an acute office visit.  This is a new problem. Associated symptom of pain located in the bottom of her bilateral feet have been going on for about 1 month following a trip to the zoo when she did a lot of walking. Pain is described as sharp. Modifying factors include Motrin which seems to help. Timing of the symptoms varies. Intensity also varies from time to time. Denies any sounds/sensations heard or felt. No previous history of trauma or injury to either foot. Medical history is positive for diabetes.  Allergies  Allergen Reactions  . Sulfa Antibiotics   . Penicillins Hives, Swelling and Rash     Current Outpatient Prescriptions on File Prior to Visit  Medication Sig Dispense Refill  . albuterol (PROVENTIL HFA;VENTOLIN HFA) 108 (90 BASE) MCG/ACT inhaler Inhale 2 puffs into the lungs every 6 (six) hours as needed for wheezing or shortness of breath. 1 Inhaler 2  . aspirin EC 81 MG EC tablet Take 1 tablet (81 mg total) by mouth daily.    . calcium carbonate (OS-CAL - DOSED IN MG OF ELEMENTAL CALCIUM) 1250 MG tablet Take 2-3 tablets by mouth 2 (two) times daily. Takes 3  tablets in the morning and 2 tablets in the evening    . metFORMIN (GLUCOPHAGE) 1000 MG tablet Take 1,000 mg by mouth 2 (two) times daily with a meal.    . Multiple Vitamin (MULTIVITAMIN WITH MINERALS) TABS Take 1 tablet by mouth daily.    . Olmesartan-Amlodipine-HCTZ (TRIBENZOR)  40-5-12.5 MG TABS Take 1 tablet by mouth daily.    . vitamin E 400 UNIT capsule Take 400 Units by mouth daily.     No current facility-administered medications on file prior to visit.    Review of Systems  Constitutional: Negative for fever and chills.  Musculoskeletal:       Positive for bilateral foot pain.  Neurological: Negative for weakness.      Objective:    BP 140/82 mmHg  Pulse 69  Temp(Src) 98 F (36.7 C) (Oral)  Resp 16  Ht  (1.651 m)  Wt 194 lb 3.2 oz (88.089 kg)  BMI 32.32 kg/m2  SpO2 98% Nursing note and vital signs reviewed.  Physical Exam  Constitutional: She is oriented to person, place, and time. She appears well-developed and well-nourished. No distress.  Cardiovascular: Normal rate, regular rhythm, normal heart sounds and intact distal pulses.   Pulmonary/Chest: Effort normal and breath sounds normal.  Musculoskeletal:  Bilateral feet - no obvious deformity, discoloration, or edema. Mild tenderness along plantar fascia bilaterally. There is slightly restricted range of motion in dorsiflexion secondary to tight gastrocnemius. Gait analysis without excessive pronation or arch collapse. Capillary refill and pulses are intact and appropriate.  Neurological: She is alert and oriented to person, place, and time.  Skin: Skin is warm and dry.  Psychiatric: She has a normal mood and affect. Her behavior  is normal. Judgment and thought content normal.       Assessment & Plan:   Problem List Items Addressed This Visit      Musculoskeletal and Integument   Plantar fasciitis - Primary    Symptoms and exam consistent with plantar fasciitis. Continue Motrin as needed for inflammation and discomfort. Initiate home exercise therapy and ice massage/ice. Recommend wearing a good supportive shoe. Follow-up if symptoms worsen or do not improve.          I am having Ms. Garciaperez maintain her multivitamin with minerals, calcium carbonate, vitamin E, metFORMIN,  Olmesartan-Amlodipine-HCTZ, aspirin, and albuterol.   Follow-up: Return in about 3 weeks (around 04/09/2016), or if symptoms worsen or fail to improve.   Jeanine Luzalone, Gregory, FNP

## 2016-03-19 NOTE — Assessment & Plan Note (Signed)
Symptoms and exam consistent with plantar fasciitis. Continue Motrin as needed for inflammation and discomfort. Initiate home exercise therapy and ice massage/ice. Recommend wearing a good supportive shoe. Follow-up if symptoms worsen or do not improve.

## 2016-03-23 ENCOUNTER — Encounter (HOSPITAL_COMMUNITY): Payer: Self-pay | Admitting: Emergency Medicine

## 2016-03-23 ENCOUNTER — Ambulatory Visit (HOSPITAL_COMMUNITY)
Admission: EM | Admit: 2016-03-23 | Discharge: 2016-03-23 | Disposition: A | Discharge status: Home / Self Care | Payer: Medicare Other | Attending: Emergency Medicine | Admitting: Emergency Medicine

## 2016-03-23 DIAGNOSIS — J40 Bronchitis, not specified as acute or chronic: Secondary | ICD-10-CM

## 2016-03-23 MED ORDER — ALBUTEROL SULFATE (2.5 MG/3ML) 0.083% IN NEBU
5.0000 mg | INHALATION_SOLUTION | Freq: Once | RESPIRATORY_TRACT | Status: AC
  Administered 2016-03-23: 5 mg via RESPIRATORY_TRACT

## 2016-03-23 MED ORDER — PREDNISONE 10 MG PO TABS: ORAL_TABLET | ORAL | Status: DC

## 2016-03-23 MED ORDER — PREDNISONE 20 MG PO TABS
ORAL_TABLET | ORAL | Status: AC
  Filled 2016-03-23: qty 3

## 2016-03-23 MED ORDER — ALBUTEROL SULFATE (2.5 MG/3ML) 0.083% IN NEBU
INHALATION_SOLUTION | RESPIRATORY_TRACT | Status: AC
  Filled 2016-03-23: qty 6

## 2016-03-23 MED ORDER — PREDNISONE 20 MG PO TABS
60.0000 mg | ORAL_TABLET | Freq: Once | ORAL | Status: AC
  Administered 2016-03-23: 60 mg via ORAL

## 2016-03-23 MED ORDER — ALBUTEROL SULFATE HFA 108 (90 BASE) MCG/ACT IN AERS: 1.0000 | INHALATION_SPRAY | RESPIRATORY_TRACT | Status: AC | PRN

## 2016-03-23 MED ORDER — GUAIFENESIN-CODEINE 100-10 MG/5ML PO SOLN: 10.0000 mL | ORAL | Status: DC | PRN

## 2016-03-23 MED ORDER — SODIUM CHLORIDE 0.9 % IN NEBU
INHALATION_SOLUTION | RESPIRATORY_TRACT | Status: AC
  Filled 2016-03-23: qty 3

## 2016-03-23 NOTE — ED Provider Notes (Signed)
CSN: [id number]     Arrival date & time 03/23/16  1259 History   First MD Initiated Contact with Patient 03/23/16 1342     Chief Complaint  Patient presents with  . Cough    Patient is a 75 y.o. female presenting with cough. The history is provided by the patient.  Cough Cough characteristics:  Non-productive, productive, hacking and nocturnal Sputum characteristics:  Clear and white Severity:  Severe Onset quality:  Gradual Duration:  4 days Timing:  Intermittent Progression:  Unchanged Chronicity:  Recurrent Smoker: no   Context: not animal exposure, not exposure to allergens, not fumes, not occupational exposure, not sick contacts, not smoke exposure, not upper respiratory infection, not weather changes and not with activity   Relieved by:  Nothing Worsened by:  Lying down and deep breathing Ineffective treatments:  Cough suppressants Associated symptoms: sore throat and wheezing   Associated symptoms: no chest pain, no chills, no diaphoresis, no fever, no rhinorrhea, no shortness of breath and no sinus congestion   Risk factors: no recent infection   Pt reports persistent cough over last 4-5 days. Lots of cough at night. Has not repsonded to OTC cough syrups or expectorants. No known fevers or subjective fevers. Pt does have a h/o bronchitis and had episode last February during which she became quite sick so and current symptoms c/w episode of Bronhcitis. Non-ssmoker and no chronic lung disease but does have other chronic illnesses.   Past Medical History  Diagnosis Date  . Diabetes mellitus without complication (HCC)   . Hypertension   . Hypercholesteremia   . Allergy   . Urine incontinence    Past Surgical History  Procedure Laterality Date  . Cholecystectomy    . Tubal ligation     Family History  Problem Relation Age of Onset  . Diabetes Other   . Hypertension Other   . Arthritis Mother   . Hyperlipidemia Mother   . Heart disease Mother   . Hypertension Mother    . Diabetes Mother   . Healthy Father    Social History  Substance Use Topics  . Smoking status: Never Smoker   . Smokeless tobacco: Never Used  . Alcohol Use: No   OB History    No data available     Review of Systems  Constitutional: Negative for fever, chills and diaphoresis.  HENT: Positive for sore throat. Negative for rhinorrhea.   Respiratory: Positive for cough and wheezing. Negative for shortness of breath.   Cardiovascular: Negative for chest pain.    Allergies  Sulfa antibiotics and Penicillins  Home Medications   Prior to Admission medications   Medication Sig Start Date End Date Taking? Authorizing Provider  albuterol (PROVENTIL HFA;VENTOLIN HFA) 108 (90 Base) MCG/ACT inhaler Inhale 1-2 puffs into the lungs every 4 (four) hours as needed for wheezing or shortness of breath (or cough). 03/23/16   Roma KayserKatherine P Bohdan Macho, NP  aspirin EC 81 MG EC tablet Take 1 tablet (81 mg total) by mouth daily. 08/20/12   Orpah CobbAjay Kadakia, MD  calcium carbonate (OS-CAL - DOSED IN MG OF ELEMENTAL CALCIUM) 1250 MG tablet Take 2-3 tablets by mouth 2 (two) times daily. Takes 3  tablets in the morning and 2 tablets in the evening    Historical Provider, MD  guaiFENesin-codeine 100-10 MG/5ML syrup Take 10 mLs by mouth every 4 (four) hours as needed for cough. 03/23/16   Roma KayserKatherine P Mackenzi Krogh, NP  metFORMIN (GLUCOPHAGE) 1000 MG tablet Take 1,000 mg by mouth 2 (  two) times daily with a meal.    Historical Provider, MD  Multiple Vitamin (MULTIVITAMIN WITH MINERALS) TABS Take 1 tablet by mouth daily.    Historical Provider, MD  Olmesartan-Amlodipine-HCTZ (TRIBENZOR) 40-5-12.5 MG TABS Take 1 tablet by mouth daily.    Historical Provider, MD  predniSONE (DELTASONE) 10 MG tablet Take 3 tabs daily for 5 days. 03/23/16   Roma KayserKatherine P Kamillah Didonato, NP  vitamin E 400 UNIT capsule Take 400 Units by mouth daily.    Historical Provider, MD   Meds Ordered and Administered this Visit   Medications  albuterol (PROVENTIL) (2.5  MG/3ML) 0.083% nebulizer solution 5 mg (5 mg Nebulization Given 03/23/16 1403)  predniSONE (DELTASONE) tablet 60 mg (60 mg Oral Given 03/23/16 1418)    BP 138/80 mmHg  Pulse 74  Temp(Src) 97.9 F (36.6 C) (Oral)  Resp 18  SpO2 98% No data found.   Physical Exam  Constitutional: She is oriented to person, place, and time. She appears well-developed and well-nourished.  HENT:  Head: Normocephalic and atraumatic.  Right Ear: Ear canal normal.  Left Ear: External ear normal.  Nose: Nose normal. Right sinus exhibits no maxillary sinus tenderness and no frontal sinus tenderness. Left sinus exhibits no maxillary sinus tenderness and no frontal sinus tenderness.  Mouth/Throat: Uvula is midline, oropharynx is clear and moist and mucous membranes are normal.  Cardiovascular: Normal rate and regular rhythm.   Pulmonary/Chest: Effort normal. She has wheezes.  Mild exp wheezes at the bases bilaterally.  Neurological: She is alert and oriented to person, place, and time.  Skin: Skin is warm and dry.  Psychiatric: She has a normal mood and affect.    ED Course  Procedures (including critical care time)  Labs Review Labs Reviewed - No data to display  Imaging Review No results found.   Visual Acuity Review  Right Eye Distance:   Left Eye Distance:   Bilateral Distance:    Right Eye Near:   Left Eye Near:    Bilateral Near:         MDM   1. Bronchitis   H/o same. No fever. Non-toxic in appearance.  Predisone 60 mg in office then 30 mg qd x 5 Albuterol neb in office (with relief) then HFA PRN Guaifenesin/codiene cough PRN.  Home care, treatment management and precautions discussed and provided in print.    Leanne ChangKatherine P Virgie Chery, NP 03/23/16 1427

## 2016-03-23 NOTE — ED Notes (Signed)
Patient reports having a cough, scratchy throat that started on Thursday.

## 2016-03-23 NOTE — Discharge Instructions (Signed)
How to Use an Inhaler  Using your inhaler correctly is very important. Good technique will make sure that the medicine reaches your lungs.   HOW TO USE AN INHALER:  1. Take the cap off the inhaler.  2. If this is the first time using your inhaler, you need to prime it. Shake the inhaler for 5 seconds. Release four puffs into the air, away from your face. Ask your doctor for help if you have questions.  3. Shake the inhaler for 5 seconds.  4. Turn the inhaler so the bottle is above the mouthpiece.  5. Put your pointer finger on top of the bottle. Your thumb holds the bottom of the inhaler.  6. Open your mouth.  7. Either hold the inhaler away from your mouth (the width of 2 fingers) or place your lips tightly around the mouthpiece. Ask your doctor which way to use your inhaler.  8. Breathe out as much air as possible.  9. Breathe in and push down on the bottle 1 time to release the medicine. You will feel the medicine go in your mouth and throat.  10. Continue to take a deep breath in very slowly. Try to fill your lungs.  11. After you have breathed in completely, hold your breath for 10 seconds. This will help the medicine to settle in your lungs. If you cannot hold your breath for 10 seconds, hold it for as long as you can before you breathe out.  12. Breathe out slowly, through pursed lips. Whistling is an example of pursed lips.  13. If your doctor has told you to take more than 1 puff, wait at least 15-30 seconds between puffs. This will help you get the best results from your medicine. Do not use the inhaler more than your doctor tells you to.  14. Put the cap back on the inhaler.  15. Follow the directions from your doctor or from the inhaler package about cleaning the inhaler.  If you use more than one inhaler, ask your doctor which inhalers to use and what order to use them in. Ask your doctor to help you figure out when you will need to refill your inhaler.   If you use a steroid inhaler, always rinse your  mouth with water after your last puff, gargle and spit out the water. Do not swallow the water.  GET HELP IF:  · The inhaler medicine only partially helps to stop wheezing or shortness of breath.  · You are having trouble using your inhaler.  · You have some increase in thick spit (phlegm).  GET HELP RIGHT AWAY IF:  · The inhaler medicine does not help your wheezing or shortness of breath or you have tightness in your chest.  · You have dizziness, headaches, or fast heart rate.  · You have chills, fever, or night sweats.  · You have a large increase of thick spit, or your thick spit is bloody.  MAKE SURE YOU:   · Understand these instructions.  · Will watch your condition.  · Will get help right away if you are not doing well or get worse.     This information is not intended to replace advice given to you by your health care provider. Make sure you discuss any questions you have with your health care provider.     Document Released: 06/24/2008 Document Revised: 07/06/2013 Document Reviewed: 04/14/2013  Elsevier Interactive Patient Education ©2016 [employer]

## 2016-03-31 ENCOUNTER — Ambulatory Visit: Payer: Medicare Other | Admitting: Family Medicine

## 2016-04-21 ENCOUNTER — Telehealth: Payer: Self-pay | Admitting: Family

## 2016-04-21 MED ORDER — ACCU-CHEK SOFTCLIX LANCET DEV MISC: 3 refills | Status: DC

## 2016-04-21 NOTE — Telephone Encounter (Signed)
Rx has been sent. Pt is aware.  

## 2016-04-21 NOTE — Telephone Encounter (Signed)
Patient walked in requesting renewal of accu-check soft clix lancets 100 ct be sent to [facility] in [place]

## 2016-04-21 NOTE — Telephone Encounter (Signed)
R

## 2016-05-05 ENCOUNTER — Encounter: Payer: Self-pay | Admitting: Family

## 2016-05-05 ENCOUNTER — Other Ambulatory Visit (INDEPENDENT_AMBULATORY_CARE_PROVIDER_SITE_OTHER): Payer: Medicare Other

## 2016-05-05 ENCOUNTER — Ambulatory Visit (INDEPENDENT_AMBULATORY_CARE_PROVIDER_SITE_OTHER): Payer: Medicare Other | Admitting: Family

## 2016-05-05 VITALS — BP 132/62 | HR 59 | Temp 98.1°F | Resp 14 | Ht 65.0 in | Wt 199.0 lb

## 2016-05-05 DIAGNOSIS — I10 Essential (primary) hypertension: Secondary | ICD-10-CM | POA: Diagnosis not present

## 2016-05-05 DIAGNOSIS — E119 Type 2 diabetes mellitus without complications: Secondary | ICD-10-CM | POA: Diagnosis not present

## 2016-05-05 DIAGNOSIS — Z0000 Encounter for general adult medical examination without abnormal findings: Secondary | ICD-10-CM | POA: Insufficient documentation

## 2016-05-05 DIAGNOSIS — E669 Obesity, unspecified: Secondary | ICD-10-CM | POA: Diagnosis not present

## 2016-05-05 LAB — LIPID PANEL
CHOLESTEROL: 329 mg/dL — AB (ref 0–200)
HDL: 54.4 mg/dL (ref >39.00)
LDL CALC: 256 mg/dL — AB (ref 0–99)
NonHDL: 274.14
Total CHOL/HDL Ratio: 6
Triglycerides: 92 mg/dL (ref 0.0–149.0)
VLDL: 18.4 mg/dL (ref 0.0–40.0)

## 2016-05-05 LAB — CBC
HEMATOCRIT: 38.4 % (ref 36.0–46.0)
Hemoglobin: 12.8 g/dL (ref 12.0–15.0)
MCHC: 33.4 g/dL (ref 30.0–36.0)
MCV: 78.1 fl (ref 78.0–100.0)
PLATELETS: 273 10*3/uL (ref 150.0–400.0)
RBC: 4.92 Mil/uL (ref 3.87–5.11)
RDW: 14.6 % (ref 11.5–15.5)
WBC: 8.2 10*3/uL (ref 4.0–10.5)

## 2016-05-05 LAB — COMPREHENSIVE METABOLIC PANEL
ALBUMIN: 4.1 g/dL (ref 3.5–5.2)
ALK PHOS: 46 U/L (ref 39–117)
ALT: 10 U/L (ref 0–35)
AST: 16 U/L (ref 0–37)
BILIRUBIN TOTAL: 0.5 mg/dL (ref 0.2–1.2)
BUN: 13 mg/dL (ref 6–23)
CHLORIDE: 105 meq/L (ref 96–112)
CO2: 28 mEq/L (ref 19–32)
CREATININE: 0.92 mg/dL (ref 0.40–1.20)
Calcium: 9.8 mg/dL (ref 8.4–10.5)
GFR: 76.93 mL/min (ref >60.00)
Glucose, Bld: 126 mg/dL — ABNORMAL HIGH (ref 70–99)
Potassium: 3.9 mEq/L (ref 3.5–5.1)
SODIUM: 141 meq/L (ref 135–145)
TOTAL PROTEIN: 7.1 g/dL (ref 6.0–8.3)

## 2016-05-05 LAB — MICROALBUMIN / CREATININE URINE RATIO
Creatinine,U: 268.2 mg/dL
MICROALB UR: 3.9 mg/dL — AB (ref 0.0–1.9)
MICROALB/CREAT RATIO: 1.5 mg/g (ref 0.0–30.0)

## 2016-05-05 LAB — VITAMIN D 25 HYDROXY (VIT D DEFICIENCY, FRACTURES): VITD: 57.87 ng/mL (ref 30.00–100.00)

## 2016-05-05 LAB — HEMOGLOBIN A1C: HEMOGLOBIN A1C: 6.1 % (ref 4.6–6.5)

## 2016-05-05 MED ORDER — ALPRAZOLAM 0.25 MG PO TABS: 0.2500 mg | ORAL_TABLET | Freq: Every day | ORAL | 0 refills | Status: AC | PRN

## 2016-05-05 NOTE — Assessment & Plan Note (Signed)
1) Anticipatory Guidance: Discussed importance of wearing a seatbelt while driving and not texting while driving; changing batteries in smoke detector at least once annually; wearing suntan lotion when outside; eating a balanced and moderate diet; getting physical activity at least 30 minutes per day.  2) Immunizations / Screenings / Labs:  Declines tetanus, Zostavax, and pneumonia vaccinations. All other immunizations are up-to-date per recommendations. Due for a dental screen encouraged to be completed independently. Obtain vitamin D screen for vitamin D deficiency. Obtain A1c and urine microalbumin for diabetes screening. Due for colon cancer screening with colonoscopy declined. All other screenings are up-to-date per recommendations. She does follow-up with gynecology for well woman exam and pessary for urinary issues. Obtain CBC, CMET, and lipid profile.  Overall well exam with risk factors for cardiovascular disease including well-controlled type 2 diabetes, controlled essential hypertension, and obesity. Chronic conditions appear adequately controlled with medication regimens. BMI of 32 indicating obesity. Recommend weight loss of 5-10% of current body weight through nutrition and physical activity. Continue other healthy lifestyle behaviors and choices. Follow-up prevention exam in 1 year. Follow-up office visit pending blood work and for chronic conditions.

## 2016-05-05 NOTE — Progress Notes (Signed)
Subjective:    Patient ID: [name], female    DOB: [birth year], 75 y.o.   MRN: [id number]  Chief Complaint  Patient presents with  . CPE    fasting    HPI:  [name] is a 75 y.o. female who presents today for an annual wellness visit.   1) Health Maintenance -   Diet - Averages about 2-3 meals per day consisting of a regular diet; Caffeine on occasion.   Exercise - Occasional; averages about 5000 steps  2) Preventative Exams / Immunizations:  Dental -- Due for exam  Vision -- Up to date   Health Maintenance  Topic Date Due  . FOOT EXAM  03/23/1953  . HEMOGLOBIN A1C  12/24/2015  . COLONOSCOPY  06/13/2016 (Originally 03/23/1993)  . TETANUS/TDAP  06/13/2016 (Originally 03/23/1962)  . INFLUENZA VACCINE  06/15/2016 (Originally 04/29/2016)  . ZOSTAVAX  04/29/2017 (Originally 03/24/2003)  . PNA vac Low Risk Adult (1 of 2 - PCV13) 04/29/2017 (Originally 03/23/2008)  . OPHTHALMOLOGY EXAM  07/01/2016  . MAMMOGRAM  06/12/2017  . DEXA SCAN  Completed     There is no immunization history on file for this patient.  RISK FACTORS  Tobacco History  Smoking Status  . Never Smoker  Smokeless Tobacco  . Never Used     Cardiac risk factors: advanced age (older than 85 for men, 67 for women), diabetes mellitus, hypertension and obesity (BMI >= 30 kg/m2).  Depression Screen  Q1: Over the past two weeks, have you felt down, depressed or hopeless? No  Q2: Over the past two weeks, have you felt little interest or pleasure in doing things? No  Have you lost interest or pleasure in daily life? No  Do you often feel hopeless? No  Do you cry easily over simple problems? No  Depression screen PHQ 2/9 05/05/2016  Decreased Interest 0  Down, Depressed, Hopeless 0  PHQ - 2 Score 0   Activities of Daily Living In your present state of health, do you have any difficulty performing the following activities?:  Driving? No Managing money?  No Feeding yourself?  No Getting from bed to chair? No Climbing a flight of stairs? No Preparing food and eating?: No Bathing or showering? No Getting dressed: No Getting to the toilet? No Using the toilet: No Moving around from place to place: No In the past year have you fallen or had a near fall?:No   Home Safety Has smoke detector and wears seat belts.  No excess sun exposure. Are there smokers in your home (other than you)?  No Do you feel safe at home?  Yes  Hearing Difficulties: No Do you often ask people to speak up or repeat themselves? No Do you experience ringing or noises in your ears? No  Do you have difficulty understanding soft or whispered voices? No    Cognitive Testing  Alert? Yes   Normal Appearance? Yes  Oriented to person? Yes  Place? Yes   Time? Yes  Recall of three objects?  Yes  Can perform simple calculations? Yes  Displays appropriate judgment? Yes  Can read the correct time from a watch face? Yes  Do you feel that you have a problem with memory? No  Do you often misplace items? No   Advanced Directives have been discussed with the patient? Yes  Current Physicians/Providers and Suppliers  1. Marcos Eke, FNP - Internal Medicine 2. Dr. Mitzi Hansen - Gynecology  Indicate any recent Medical Services you may have  received from other than [facility] providers in the past year (date may be approximate).  All answers were reviewed with the patient and necessary referrals were made:  Jeanine Luz, FNP   05/05/2016    Allergies  Allergen Reactions  . Sulfa Antibiotics   . Penicillins Hives, Swelling and Rash     Outpatient Medications Prior to Visit  Medication Sig Dispense Refill  . albuterol (PROVENTIL HFA;VENTOLIN HFA) 108 (90 Base) MCG/ACT inhaler Inhale 1-2 puffs into the lungs every 4 (four) hours as needed for wheezing or shortness of breath (or cough). 1 Inhaler 0  . aspirin EC 81 MG EC tablet Take 1 tablet (81 mg total) by mouth daily.    . calcium carbonate (OS-CAL  - DOSED IN MG OF ELEMENTAL CALCIUM) 1250 MG tablet Take 2-3 tablets by mouth 2 (two) times daily. Takes 3  tablets in the morning and 2 tablets in the evening    . Lancet Devices (ACCU-CHEK SOFTCLIX) lancets Use as 1 lancet to test to check blood sugars 2 time daily 1 each 3  . metFORMIN (GLUCOPHAGE) 1000 MG tablet Take 1,000 mg by mouth 2 (two) times daily with a meal.    . Multiple Vitamin (MULTIVITAMIN WITH MINERALS) TABS Take 1 tablet by mouth daily.    . Olmesartan-Amlodipine-HCTZ (TRIBENZOR) 40-5-12.5 MG TABS Take 1 tablet by mouth daily.    . vitamin E 400 UNIT capsule Take 400 Units by mouth daily.    Marland Kitchen guaiFENesin-codeine 100-10 MG/5ML syrup Take 10 mLs by mouth every 4 (four) hours as needed for cough. 120 mL 0  . predniSONE (DELTASONE) 10 MG tablet Take 3 tabs daily for 5 days. 15 tablet 0   No facility-administered medications prior to visit.      Past Medical History:  Diagnosis Date  . Allergy   . Diabetes mellitus without complication (HCC)   . Hypercholesteremia   . Hypertension   . Urine incontinence      Past Surgical History:  Procedure Laterality Date  . CHOLECYSTECTOMY    . TUBAL LIGATION       Family History  Problem Relation Age of Onset  . Arthritis Mother   . Hyperlipidemia Mother   . Heart disease Mother   . Hypertension Mother   . Diabetes Mother   . Healthy Father   . Diabetes Other   . Hypertension Other      Social History   Social History  . Marital status: Widowed    Spouse name: N/A  . Number of children: 6  . Years of education: 39   Occupational History  . Retired    Social History Main Topics  . Smoking status: Never Smoker  . Smokeless tobacco: Never Used  . Alcohol use No  . Drug use: No  . Sexual activity: Not on file   Other Topics Concern  . Not on file   Social History Narrative   Denies abuse and feels safe at home.   Denies religious beliefs effecting health care.      Review of Systems  Constitutional:  Denies fever, chills, fatigue, or significant weight gain/loss. HENT: Head: Denies headache or neck pain Ears: Denies changes in hearing, ringing in ears, earache, drainage Nose: Denies discharge, stuffiness, itching, nosebleed, sinus pain Throat: Denies sore throat, hoarseness, dry mouth, sores, thrush Eyes: Denies loss/changes in vision, pain, redness, blurry/double vision, flashing lights Cardiovascular: Denies chest pain/discomfort, tightness, palpitations, shortness of breath with activity, difficulty lying down, swelling, sudden awakening with shortness of  breath Respiratory: Denies shortness of breath, cough, sputum production, wheezing Gastrointestinal: Denies dysphasia, heartburn, change in appetite, nausea, change in bowel habits, rectal bleeding, constipation, diarrhea, yellow skin or eyes Genitourinary: Denies frequency, urgency, burning/pain, blood in urine, incontinence, change in urinary strength. Musculoskeletal: Denies muscle/joint pain, stiffness, back pain, redness or swelling of joints, trauma Skin: Denies rashes, lumps, itching, dryness, color changes, or hair/nail changes Neurological: Denies dizziness, fainting, seizures, weakness, numbness, tingling, tremor Psychiatric - Denies nervousness, stress, depression or memory loss Endocrine: Denies heat or cold intolerance, sweating, frequent urination, excessive thirst, changes in appetite Hematologic: Denies ease of bruising or bleeding    Objective:     BP 132/62 (BP Location: Left Arm, Patient Position: Sitting, Cuff Size: Normal)   Pulse (!) 59   Temp 98.1 F (36.7 C) (Oral)   Resp 14   Ht 5\' 5"  (1.651 m)   Wt 199 lb (90.3 kg)   SpO2 97%   BMI 33.12 kg/m  Nursing note and vital signs reviewed.  Physical Exam  Constitutional: She is oriented to person, place, and time. She appears well-developed and well-nourished.  HENT:  Head: Normocephalic.  Right Ear: Hearing, tympanic membrane, external ear and ear canal  normal.  Left Ear: Hearing, tympanic membrane, external ear and ear canal normal.  Nose: Nose normal.  Mouth/Throat: Uvula is midline, oropharynx is clear and moist and mucous membranes are normal.  Eyes: Conjunctivae and EOM are normal. Pupils are equal, round, and reactive to light.  Neck: Neck supple. No JVD present. No tracheal deviation present. No thyromegaly present.  Cardiovascular: Normal rate, regular rhythm, normal heart sounds and intact distal pulses.   Pulmonary/Chest: Effort normal and breath sounds normal.  Abdominal: Soft. Bowel sounds are normal. She exhibits no distension and no mass. There is no tenderness. There is no rebound and no guarding.  Musculoskeletal: Normal range of motion. She exhibits no edema or tenderness.  Lymphadenopathy:    She has no cervical adenopathy.  Neurological: She is alert and oriented to person, place, and time. She has normal reflexes. No cranial nerve deficit. She exhibits normal muscle tone. Coordination normal.  Diabetic Foot Exam - Simple   Simple Foot Form Diabetic Foot exam was performed with the following findings:  Yes  05/05/2016  8:30 AM  Visual Inspection No deformities, no ulcerations, no other skin breakdown bilaterally:  Yes Sensation Testing Intact to touch and monofilament testing bilaterally:  Yes Pulse Check Posterior Tibialis and Dorsalis pulse intact bilaterally:  Yes  Skin: Skin is warm and dry.  Psychiatric: She has a normal mood and affect. Her behavior is normal. Judgment and thought content normal.       Assessment & Plan:   During the course of the visit the patient was educated and counseled about appropriate screening and preventive services including:    Pneumococcal vaccine   Influenza vaccine  Colorectal cancer screening  Diabetes screening  Nutrition counseling   Diet review for nutrition referral? Yes ____  Not Indicated _X___   Patient Instructions (the written plan) was given to the  patient.  Medicare Attestation I have personally reviewed: The patient's medical and social history Their use of alcohol, tobacco or illicit drugs Their current medications and supplements The patient's functional ability including ADLs,fall risks, home safety risks, cognitive, and hearing and visual impairment Diet and physical activities Evidence for depression or mood disorders  The patient's weight, height, BMI,  have been recorded in the chart.  I have made referrals, counseling, and  provided education to the patient based on review of the above and I have provided the patient with a written personalized care plan for preventive services.     Jeanine Luz, FNP   05/05/2016    Problem List Items Addressed This Visit      Cardiovascular and Mediastinum   Essential hypertension    Hypertension is well-controlled current regimen and no adverse side effects or hypotensive readings. No symptoms of end organ damage. No symptoms of worsening headache of life. Continue current dosage of Tribenzor. Encouraged to monitor blood pressure at home. Continue to follow low-sodium diet.        Endocrine   Type 2 diabetes mellitus, controlled (HCC) - Primary    Type 2 diabetes with most recent A1c of 5.8 indicating very well controlled with current regimen. Diabetic foot exam completed today. Diabetic eye exam is up-to-date. Declines Pneumovax. Maintained olmesartan for CAD risk reduction. Continue current dosage of metformin. Follow-up pending A1c and microalbumin results.      Relevant Orders   Hemoglobin A1c (Completed)   Urine Microalbumin w/creat. ratio (Completed)     Other   Medicare annual wellness visit, subsequent    Reviewed and updated patient's medical, surgical, family and social history. Medications and allergies were also reviewed. Basic screenings for depression, activities of daily living, hearing, cognition and safety were performed. Provider list was updated and health plan  was provided to the patient.       Routine general medical examination at a health care facility    1) Anticipatory Guidance: Discussed importance of wearing a seatbelt while driving and not texting while driving; changing batteries in smoke detector at least once annually; wearing suntan lotion when outside; eating a balanced and moderate diet; getting physical activity at least 30 minutes per day.  2) Immunizations / Screenings / Labs:  Declines tetanus, Zostavax, and pneumonia vaccinations. All other immunizations are up-to-date per recommendations. Due for a dental screen encouraged to be completed independently. Obtain vitamin D screen for vitamin D deficiency. Obtain A1c and urine microalbumin for diabetes screening. Due for colon cancer screening with colonoscopy declined. All other screenings are up-to-date per recommendations. She does follow-up with gynecology for well woman exam and pessary for urinary issues. Obtain CBC, CMET, and lipid profile.  Overall well exam with risk factors for cardiovascular disease including well-controlled type 2 diabetes, controlled essential hypertension, and obesity. Chronic conditions appear adequately controlled with medication regimens. BMI of 32 indicating obesity. Recommend weight loss of 5-10% of current body weight through nutrition and physical activity. Continue other healthy lifestyle behaviors and choices. Follow-up prevention exam in 1 year. Follow-up office visit pending blood work and for chronic conditions.      Relevant Orders   CBC (Completed)   Comprehensive metabolic panel (Completed)   Lipid panel (Completed)   VITAMIN D 25 Hydroxy (Vit-D Deficiency, Fractures) (Completed)   Obesity    BMI of 33. Recommend weight loss of 5-10% of current body weight through nutrition and physical activity. Recommend increasing physical activity to 30 minutes of moderate level activity daily. Encourage nutritional intake that focuses on nutrient dense  foods and is moderate, varied, and balanced and is low in saturated fats and processed/sugary foods. Continue to monitor.        Other Visit Diagnoses   None.

## 2016-05-05 NOTE — Assessment & Plan Note (Signed)
Hypertension is well-controlled current regimen and no adverse side effects or hypotensive readings. No symptoms of end organ damage. No symptoms of worsening headache of life. Continue current dosage of Tribenzor. Encouraged to monitor blood pressure at home. Continue to follow low-sodium diet.

## 2016-05-05 NOTE — Assessment & Plan Note (Signed)
Type 2 diabetes with most recent A1c of 5.8 indicating very well controlled with current regimen. Diabetic foot exam completed today. Diabetic eye exam is up-to-date. Declines Pneumovax. Maintained olmesartan for CAD risk reduction. Continue current dosage of metformin. Follow-up pending A1c and microalbumin results.

## 2016-05-05 NOTE — Assessment & Plan Note (Signed)
BMI of 33. Recommend weight loss of 5-10% of current body weight through nutrition and physical activity. Recommend increasing physical activity to 30 minutes of moderate level activity daily. Encourage nutritional intake that focuses on nutrient dense foods and is moderate, varied, and balanced and is low in saturated fats and processed/sugary foods. Continue to monitor.

## 2016-05-05 NOTE — Assessment & Plan Note (Signed)
Reviewed and updated patient's medical, surgical, family and social history. Medications and allergies were also reviewed. Basic screenings for depression, activities of daily living, hearing, cognition and safety were performed. Provider list was updated and health plan was provided to the patient.  

## 2016-05-05 NOTE — Patient Instructions (Addendum)
Thank you for choosing [facility].  Summary/Instructions:  Your prescription(s) have been submitted to your pharmacy or been printed and provided for you. Please take as directed and contact our office if you believe you are having problem(s) with the medication(s) or have any questions.  Please stop by the lab on the lower level of the building for your blood work. Your results will be released to [place] (or called to you) after review, usually within 72 hours after test completion. If any changes need to be made, you will be notified at that same time.  1. The lab is open from 7:30am to 5:30 pm Monday-Friday  2. No appointment is necessary  3. Fasting (if needed) is 6-8 hours after food and drink; black  coffee and water are okay   If your symptoms worsen or fail to improve, please contact our office for further instruction, or in case of emergency go directly to the emergency room at the closest medical facility.   Health Maintenance  Topic Date Due  . FOOT EXAM  03/23/1951  . HEMOGLOBIN A1C  12/24/2015  . COLONOSCOPY  06/13/2016 (Originally 03/23/1993)  . TETANUS/TDAP  06/13/2016 (Originally 03/23/1962)  . INFLUENZA VACCINE  06/15/2016 (Originally 04/29/2016)  . ZOSTAVAX  04/29/2017 (Originally 03/24/2003)  . PNA vac Low Risk Adult (1 of 2 - PCV13) 04/29/2017 (Originally 03/23/2008)  . OPHTHALMOLOGY EXAM  07/01/2016  . MAMMOGRAM  06/12/2017  . DEXA SCAN  Completed    Health Maintenance, Female Adopting a healthy lifestyle and getting preventive care can go a long way to promote health and wellness. Talk with your health care provider about what schedule of regular examinations is right for you. This is a good chance for you to check in with your provider about disease prevention and staying healthy. In between checkups, there are plenty of things you can do on your own. Experts have done a lot of research about which lifestyle changes and preventive measures are most likely to  keep you healthy. Ask your health care provider for more information. WEIGHT AND DIET  Eat a healthy diet  Be sure to include plenty of vegetables, fruits, low-fat dairy products, and lean protein.  Do not eat a lot of foods high in solid fats, added sugars, or salt.  Get regular exercise. This is one of the most important things you can do for your health.  Most adults should exercise for at least 150 minutes each week. The exercise should increase your heart rate and make you sweat (moderate-intensity exercise).  Most adults should also do strengthening exercises at least twice a week. This is in addition to the moderate-intensity exercise.  Maintain a healthy weight  Body mass index (BMI) is a measurement that can be used to identify possible weight problems. It estimates body fat based on height and weight. Your health care provider can help determine your BMI and help you achieve or maintain a healthy weight.  For females 51 years of age and older:   A BMI below 18.5 is considered underweight.  A BMI of 18.5 to 24.9 is normal.  A BMI of 25 to 29.9 is considered overweight.  A BMI of 30 and above is considered obese.  Watch levels of cholesterol and blood lipids  You should start having your blood tested for lipids and cholesterol at 73 years of age, then have this test every 5 years.  You may need to have your cholesterol levels checked more often if:  Your lipid or  cholesterol levels are high.  You are older than 73 years of age.  You are at high risk for heart disease.  CANCER SCREENING   Lung Cancer  Lung cancer screening is recommended for adults 83-56 years old who are at high risk for lung cancer because of a history of smoking.  A yearly low-dose CT scan of the lungs is recommended for people who:  Currently smoke.  Have quit within the past 15 years.  Have at least a 30-pack-year history of smoking. A pack year is smoking an average of one pack of  cigarettes a day for 1 year.  Yearly screening should continue until it has been 15 years since you quit.  Yearly screening should stop if you develop a health problem that would prevent you from having lung cancer treatment.  Breast Cancer  Practice breast self-awareness. This means understanding how your breasts normally appear and feel.  It also means doing regular breast self-exams. Let your health care provider know about any changes, no matter how small.  If you are in your 20s or 30s, you should have a clinical breast exam (CBE) by a health care provider every 1-3 years as part of a regular health exam.  If you are 69 or older, have a CBE every year. Also consider having a breast X-ray (mammogram) every year.  If you have a family history of breast cancer, talk to your health care provider about genetic screening.  If you are at high risk for breast cancer, talk to your health care provider about having an MRI and a mammogram every year.  Breast cancer gene (BRCA) assessment is recommended for women who have family members with BRCA-related cancers. BRCA-related cancers include:  Breast.  Ovarian.  Tubal.  Peritoneal cancers.  Results of the assessment will determine the need for genetic counseling and BRCA1 and BRCA2 testing. Cervical Cancer Your health care provider may recommend that you be screened regularly for cancer of the pelvic organs (ovaries, uterus, and vagina). This screening involves a pelvic examination, including checking for microscopic changes to the surface of your cervix (Pap test). You may be encouraged to have this screening done every 3 years, beginning at age 38.  For women ages 29-65, health care providers may recommend pelvic exams and Pap testing every 3 years, or they may recommend the Pap and pelvic exam, combined with testing for human papilloma virus (HPV), every 5 years. Some types of HPV increase your risk of cervical cancer. Testing for HPV  may also be done on women of any age with unclear Pap test results.  Other health care providers may not recommend any screening for nonpregnant women who are considered low risk for pelvic cancer and who do not have symptoms. Ask your health care provider if a screening pelvic exam is right for you.  If you have had past treatment for cervical cancer or a condition that could lead to cancer, you need Pap tests and screening for cancer for at least 20 years after your treatment. If Pap tests have been discontinued, your risk factors (such as having a new sexual partner) need to be reassessed to determine if screening should resume. Some women have medical problems that increase the chance of getting cervical cancer. In these cases, your health care provider may recommend more frequent screening and Pap tests. Colorectal Cancer  This type of cancer can be detected and often prevented.  Routine colorectal cancer screening usually begins at 73 years of age and  continues through 73 years of age.  Your health care provider may recommend screening at an earlier age if you have risk factors for colon cancer.  Your health care provider may also recommend using home test kits to check for hidden blood in the stool.  A small camera at the end of a tube can be used to examine your colon directly (sigmoidoscopy or colonoscopy). This is done to check for the earliest forms of colorectal cancer.  Routine screening usually begins at age 69.  Direct examination of the colon should be repeated every 5-10 years through 73 years of age. However, you may need to be screened more often if early forms of precancerous polyps or small growths are found. Skin Cancer  Check your skin from head to toe regularly.  Tell your health care provider about any new moles or changes in moles, especially if there is a change in a mole's shape or color.  Also tell your health care provider if you have a mole that is larger than  the size of a pencil eraser.  Always use sunscreen. Apply sunscreen liberally and repeatedly throughout the day.  Protect yourself by wearing long sleeves, pants, a wide-brimmed hat, and sunglasses whenever you are outside. HEART DISEASE, DIABETES, AND HIGH BLOOD PRESSURE   High blood pressure causes heart disease and increases the risk of stroke. High blood pressure is more likely to develop in:  People who have blood pressure in the high end of the normal range (130-139/85-89 mm Hg).  People who are overweight or obese.  People who are African American.  If you are 20-39 years of age, have your blood pressure checked every 3-5 years. If you are 75 years of age or older, have your blood pressure checked every year. You should have your blood pressure measured twice--once when you are at a hospital or clinic, and once when you are not at a hospital or clinic. Record the average of the two measurements. To check your blood pressure when you are not at a hospital or clinic, you can use:  An automated blood pressure machine at a pharmacy.  A home blood pressure monitor.  If you are between 79 years and 39 years old, ask your health care provider if you should take aspirin to prevent strokes.  Have regular diabetes screenings. This involves taking a blood sample to check your fasting blood sugar level.  If you are at a normal weight and have a low risk for diabetes, have this test once every three years after 73 years of age.  If you are overweight and have a high risk for diabetes, consider being tested at a younger age or more often. PREVENTING INFECTION  Hepatitis B  If you have a higher risk for hepatitis B, you should be screened for this virus. You are considered at high risk for hepatitis B if:  You were born in a country where hepatitis B is common. Ask your health care provider which countries are considered high risk.  Your parents were born in a high-risk country, and you  have not been immunized against hepatitis B (hepatitis B vaccine).  You have HIV or AIDS.  You use needles to inject street drugs.  You live with someone who has hepatitis B.  You have had sex with someone who has hepatitis B.  You get hemodialysis treatment.  You take certain medicines for conditions, including cancer, organ transplantation, and autoimmune conditions. Hepatitis C  Blood testing is recommended for:  Everyone born from [birth year] through 1963.  Anyone with known risk factors for hepatitis C. Sexually transmitted infections (STIs)  You should be screened for sexually transmitted infections (STIs) including gonorrhea and chlamydia if:  You are sexually active and are younger than 73 years of age.  You are older than 73 years of age and your health care provider tells you that you are at risk for this type of infection.  Your sexual activity has changed since you were last screened and you are at an increased risk for chlamydia or gonorrhea. Ask your health care provider if you are at risk.  If you do not have HIV, but are at risk, it may be recommended that you take a prescription medicine daily to prevent HIV infection. This is called pre-exposure prophylaxis (PrEP). You are considered at risk if:  You are sexually active and do not regularly use condoms or know the HIV status of your partner(s).  You take drugs by injection.  You are sexually active with a partner who has HIV. Talk with your health care provider about whether you are at high risk of being infected with HIV. If you choose to begin PrEP, you should first be tested for HIV. You should then be tested every 3 months for as long as you are taking PrEP.  PREGNANCY   If you are premenopausal and you may become pregnant, ask your health care provider about preconception counseling.  If you may become pregnant, take 400 to 800 micrograms (mcg) of folic acid every day.  If you want to prevent pregnancy,  talk to your health care provider about birth control (contraception). OSTEOPOROSIS AND MENOPAUSE   Osteoporosis is a disease in which the bones lose minerals and strength with aging. This can result in serious bone fractures. Your risk for osteoporosis can be identified using a bone density scan.  If you are 50 years of age or older, or if you are at risk for osteoporosis and fractures, ask your health care provider if you should be screened.  Ask your health care provider whether you should take a calcium or vitamin D supplement to lower your risk for osteoporosis.  Menopause may have certain physical symptoms and risks.  Hormone replacement therapy may reduce some of these symptoms and risks. Talk to your health care provider about whether hormone replacement therapy is right for you.  HOME CARE INSTRUCTIONS   Schedule regular health, dental, and eye exams.  Stay current with your immunizations.   Do not use any tobacco products including cigarettes, chewing tobacco, or electronic cigarettes.  If you are pregnant, do not drink alcohol.  If you are breastfeeding, limit how much and how often you drink alcohol.  Limit alcohol intake to no more than 1 drink per day for nonpregnant women. One drink equals 12 ounces of beer, 5 ounces of wine, or 1 ounces of hard liquor.  Do not use street drugs.  Do not share needles.  Ask your health care provider for help if you need support or information about quitting drugs.  Tell your health care provider if you often feel depressed.  Tell your health care provider if you have ever been abused or do not feel safe at home.   This information is not intended to replace advice given to you by your health care provider. Make sure you discuss any questions you have with your health care provider.   Document Released: 03/31/2011 Document Revised: 10/06/2014 Document Reviewed: 08/17/2013 Elsevier Interactive Patient  Education 2016 [place] Directive Advance directives are the legal documents that allow you to make choices about your health care and medical treatment if you cannot speak for yourself. Advance directives are a way for you to communicate your wishes to family, friends, and health care providers. The specified people can then convey your decisions about end-of-life care to avoid confusion if you should become unable to communicate. Ideally, the process of discussing and writing advance directives should happen over time rather than making decisions all at once. Advance directives can be modified as your situation changes, and you can change your mind at any time, even after you have signed the advance directives. Each state has its own laws regarding advance directives. You may want to check with your health care provider, attorney, or state representative about the law in your state. Below are some examples of advance directives. LIVING WILL A living will is a set of instructions documenting your wishes about medical care when you cannot care for yourself. It is used if you become:  Terminally ill.  Incapacitated.  Unable to communicate.  Unable to make decisions. Items to consider in your living will include:  The use or non-use of life-sustaining equipment, such as dialysis machines and breathing machines (ventilators).  A do not resuscitate (DNR) order, which is the instruction not to use cardiopulmonary resuscitation (CPR) if breathing or heartbeat stops.  Tube feeding.  Withholding of food and fluids.  Comfort (palliative) care when the goal becomes comfort rather than a cure.  Organ and tissue donation. A living will does not give instructions about distribution of your money and property if you should pass away. It is advisable to seek the expert advice of a lawyer in drawing up a will regarding your possessions. Decisions about taxes, beneficiaries, and asset distribution will be legally  binding. This process can relieve your family and friends of any burdens surrounding disputes or questions that may come up about the allocation of your assets. DO NOT RESUSCITATE (DNR) A do not resuscitate (DNR) order is a request to not have CPR in the event that your heart stops beating or you stop breathing. Unless given other instructions, a health care provider will try to help any patient whose heart has stopped or who has stopped breathing.  HEALTH CARE PROXY AND DURABLE POWER OF ATTORNEY FOR HEALTH CARE A health care proxy is a person (agent) appointed to make medical decisions for you if you cannot. Generally, people choose someone they know well and trust to represent their preferences when they can no longer do so. You should be sure to ask this person for agreement to act as your agent. An agent may have to exercise judgment in the event of a medical decision for which your wishes are not known. The durable power of attorney for health care is the legal document that names your health care proxy. Once written, it should be:  Signed.  Notarized.  Dated.  Copied.  Witnessed.  Incorporated into your medical record. You may also want to appoint someone to manage your financial affairs if you cannot. This is called a durable power of attorney for finances. It is a separate legal document from the durable power of attorney for health care. You may choose the same person or someone different from your health care proxy to act as your agent in financial matters.   This information is not intended to replace advice given to you by your health care  provider. Make sure you discuss any questions you have with your health care provider.   Document Released: 12/23/2007 Document Revised: 09/20/2013 Document Reviewed: 02/02/2013 Elsevier Interactive Patient Education [employer].

## 2016-05-07 ENCOUNTER — Telehealth: Payer: Self-pay

## 2016-05-07 MED ORDER — PRAVASTATIN SODIUM 20 MG PO TABS: 20.0000 mg | ORAL_TABLET | Freq: Every day | ORAL | 0 refills | Status: DC

## 2016-05-07 NOTE — Telephone Encounter (Signed)
Patient called about lab results. I informed her of the notes. She is ok with taking some medication but she does not want to take Lipitor. I informed her to call her pharmacy later to see if anything is there. Or someone would call her back and let her know.Thank you.

## 2016-05-07 NOTE — Telephone Encounter (Signed)
Pravastatin sent to pharmacy. It tends to be less likely to cause side effects.

## 2016-05-07 NOTE — Telephone Encounter (Signed)
Pt aware.

## 2016-11-03 ENCOUNTER — Other Ambulatory Visit: Payer: Self-pay | Admitting: Family

## 2017-02-06 LAB — HM DIABETES EYE EXAM

## 2017-02-13 ENCOUNTER — Encounter: Payer: Self-pay | Admitting: Family

## 2017-05-07 ENCOUNTER — Encounter: Payer: Self-pay | Admitting: Family

## 2017-05-07 ENCOUNTER — Other Ambulatory Visit (INDEPENDENT_AMBULATORY_CARE_PROVIDER_SITE_OTHER): Payer: Medicare Other

## 2017-05-07 ENCOUNTER — Ambulatory Visit (INDEPENDENT_AMBULATORY_CARE_PROVIDER_SITE_OTHER): Payer: Medicare Other | Admitting: Family

## 2017-05-07 ENCOUNTER — Other Ambulatory Visit: Payer: Self-pay | Admitting: Family

## 2017-05-07 VITALS — BP 132/70 | HR 75 | Temp 98.6°F | Resp 16 | Ht 65.0 in | Wt 184.0 lb

## 2017-05-07 DIAGNOSIS — I10 Essential (primary) hypertension: Secondary | ICD-10-CM

## 2017-05-07 DIAGNOSIS — E119 Type 2 diabetes mellitus without complications: Secondary | ICD-10-CM

## 2017-05-07 DIAGNOSIS — Z0000 Encounter for general adult medical examination without abnormal findings: Secondary | ICD-10-CM | POA: Diagnosis not present

## 2017-05-07 LAB — CBC
HEMATOCRIT: 38.1 % (ref 36.0–46.0)
HEMOGLOBIN: 12.6 g/dL (ref 12.0–15.0)
MCHC: 33.1 g/dL (ref 30.0–36.0)
MCV: 79 fl (ref 78.0–100.0)
PLATELETS: 265 10*3/uL (ref 150.0–400.0)
RBC: 4.83 Mil/uL (ref 3.87–5.11)
RDW: 14.6 % (ref 11.5–15.5)
WBC: 7.1 10*3/uL (ref 4.0–10.5)

## 2017-05-07 LAB — COMPREHENSIVE METABOLIC PANEL
ALBUMIN: 4.2 g/dL (ref 3.5–5.2)
ALK PHOS: 43 U/L (ref 39–117)
ALT: 11 U/L (ref 0–35)
AST: 15 U/L (ref 0–37)
BILIRUBIN TOTAL: 0.5 mg/dL (ref 0.2–1.2)
BUN: 16 mg/dL (ref 6–23)
CO2: 29 mEq/L (ref 19–32)
Calcium: 9.8 mg/dL (ref 8.4–10.5)
Chloride: 104 mEq/L (ref 96–112)
Creatinine, Ser: 0.96 mg/dL (ref 0.40–1.20)
GFR: 73.04 mL/min (ref >60.00)
Glucose, Bld: 103 mg/dL — ABNORMAL HIGH (ref 70–99)
POTASSIUM: 4 meq/L (ref 3.5–5.1)
Sodium: 140 mEq/L (ref 135–145)
TOTAL PROTEIN: 7.1 g/dL (ref 6.0–8.3)

## 2017-05-07 LAB — MICROALBUMIN / CREATININE URINE RATIO
CREATININE, U: 290 mg/dL
MICROALB/CREAT RATIO: 1.3 mg/g (ref 0.0–30.0)
Microalb, Ur: 3.8 mg/dL — ABNORMAL HIGH (ref 0.0–1.9)

## 2017-05-07 LAB — LIPID PANEL
CHOLESTEROL: 296 mg/dL — AB (ref 0–200)
HDL: 48.5 mg/dL (ref >39.00)
LDL Cholesterol: 229 mg/dL — ABNORMAL HIGH (ref 0–99)
NONHDL: 247.5
Total CHOL/HDL Ratio: 6
Triglycerides: 94 mg/dL (ref 0.0–149.0)
VLDL: 18.8 mg/dL (ref 0.0–40.0)

## 2017-05-07 LAB — HEMOGLOBIN A1C: HEMOGLOBIN A1C: 6 % (ref 4.6–6.5)

## 2017-05-07 MED ORDER — PRAVASTATIN SODIUM 40 MG PO TABS: 20.0000 mg | ORAL_TABLET | Freq: Every day | ORAL | 1 refills | Status: DC

## 2017-05-07 NOTE — Assessment & Plan Note (Signed)
Reviewed and updated patient's medical, surgical, family and social history. Medications and allergies were also reviewed. Basic screenings for depression, activities of daily living, hearing, cognition and safety were performed. Provider list was updated and health plan was provided to the patient.  

## 2017-05-07 NOTE — Assessment & Plan Note (Signed)
Type 2 diabetes previously well controlled with current medication regimen and no adverse side effects. Obtain hemoglobin A1c and urine microalbumin. Diabetic foot exam completed today. Declines Pneumovax. Maintained on pravastatin and olmesartan for CAD risk reduction. Continue current dosage of metformin pending A1c results.

## 2017-05-07 NOTE — Patient Instructions (Addendum)
Thank you for choosing [facility].  SUMMARY AND INSTRUCTIONS:  Please continue take your medications as prescribed.  We will check your blood work today.   You are doing well overall!  Continue with exercise!  Check on price of Cologuard for colon cancer screening.   Labs:  Please stop by the lab on the lower level of the building for your blood work. Your results will be released to [place] (or called to you) after review, usually within 72 hours after test completion. If any changes need to be made, you will be notified at that same time.  1.) The lab is open from 7:30am to 5:30 pm Monday-Friday 2.) No appointment is necessary 3.) Fasting (if needed) is 6-8 hours after food and drink; black coffee and water are okay   Imaging / Radiology:  Please stop by radiology on the basement level of the building for your x-rays. Your results will be released to [place] (or called to you) after review, usually within 72 hours after test completion. If any treatments or changes are necessary, you will be notified at that same time.  Referrals:  Referrals have been made during this visit. You should expect to hear back from our schedulers in about 7-10 days in regards to establishing an appointment with the specialists we discussed.   Follow up:  If your symptoms worsen or fail to improve, please contact our office for further instruction, or in case of emergency go directly to the emergency room at the closest medical facility.    Health Maintenance  Topic Date Due  . TETANUS/TDAP  03/23/1962  . COLONOSCOPY  03/23/1993  . PNA vac Low Risk Adult (1 of 2 - PCV13) 03/23/2008  . HEMOGLOBIN A1C  11/05/2016  . INFLUENZA VACCINE  04/29/2017  . FOOT EXAM  05/05/2017  . MAMMOGRAM  06/12/2017  . OPHTHALMOLOGY EXAM  02/06/2018  . DEXA SCAN  Completed    Health Maintenance, Female Adopting a healthy lifestyle and getting preventive care can go a long way to promote health and  wellness. Talk with your health care provider about what schedule of regular examinations is right for you. This is a good chance for you to check in with your provider about disease prevention and staying healthy. In between checkups, there are plenty of things you can do on your own. Experts have done a lot of research about which lifestyle changes and preventive measures are most likely to keep you healthy. Ask your health care provider for more information. Weight and diet Eat a healthy diet  Be sure to include plenty of vegetables, fruits, low-fat dairy products, and lean protein.  Do not eat a lot of foods high in solid fats, added sugars, or salt.  Get regular exercise. This is one of the most important things you can do for your health. ? Most adults should exercise for at least 150 minutes each week. The exercise should increase your heart rate and make you sweat (moderate-intensity exercise). ? Most adults should also do strengthening exercises at least twice a week. This is in addition to the moderate-intensity exercise.  Maintain a healthy weight  Body mass index (BMI) is a measurement that can be used to identify possible weight problems. It estimates body fat based on height and weight. Your health care provider can help determine your BMI and help you achieve or maintain a healthy weight.  For females 49 years of age and older: ? A BMI below 18.5 is considered underweight. ?  A BMI of 18.5 to 24.9 is normal. ? A BMI of 25 to 29.9 is considered overweight. ? A BMI of 30 and above is considered obese.  Watch levels of cholesterol and blood lipids  You should start having your blood tested for lipids and cholesterol at 74 years of age, then have this test every 5 years.  You may need to have your cholesterol levels checked more often if: ? Your lipid or cholesterol levels are high. ? You are older than 74 years of age. ? You are at high risk for heart disease.  Cancer  screening Lung Cancer  Lung cancer screening is recommended for adults 32-28 years old who are at high risk for lung cancer because of a history of smoking.  A yearly low-dose CT scan of the lungs is recommended for people who: ? Currently smoke. ? Have quit within the past 15 years. ? Have at least a 30-pack-year history of smoking. A pack year is smoking an average of one pack of cigarettes a day for 1 year.  Yearly screening should continue until it has been 15 years since you quit.  Yearly screening should stop if you develop a health problem that would prevent you from having lung cancer treatment.  Breast Cancer  Practice breast self-awareness. This means understanding how your breasts normally appear and feel.  It also means doing regular breast self-exams. Let your health care provider know about any changes, no matter how small.  If you are in your 20s or 30s, you should have a clinical breast exam (CBE) by a health care provider every 1-3 years as part of a regular health exam.  If you are 46 or older, have a CBE every year. Also consider having a breast X-ray (mammogram) every year.  If you have a family history of breast cancer, talk to your health care provider about genetic screening.  If you are at high risk for breast cancer, talk to your health care provider about having an MRI and a mammogram every year.  Breast cancer gene (BRCA) assessment is recommended for women who have family members with BRCA-related cancers. BRCA-related cancers include: ? Breast. ? Ovarian. ? Tubal. ? Peritoneal cancers.  Results of the assessment will determine the need for genetic counseling and BRCA1 and BRCA2 testing.  Cervical Cancer Your health care provider may recommend that you be screened regularly for cancer of the pelvic organs (ovaries, uterus, and vagina). This screening involves a pelvic examination, including checking for microscopic changes to the surface of your cervix  (Pap test). You may be encouraged to have this screening done every 3 years, beginning at age 26.  For women ages 76-65, health care providers may recommend pelvic exams and Pap testing every 3 years, or they may recommend the Pap and pelvic exam, combined with testing for human papilloma virus (HPV), every 5 years. Some types of HPV increase your risk of cervical cancer. Testing for HPV may also be done on women of any age with unclear Pap test results.  Other health care providers may not recommend any screening for nonpregnant women who are considered low risk for pelvic cancer and who do not have symptoms. Ask your health care provider if a screening pelvic exam is right for you.  If you have had past treatment for cervical cancer or a condition that could lead to cancer, you need Pap tests and screening for cancer for at least 20 years after your treatment. If Pap tests have  been discontinued, your risk factors (such as having a new sexual partner) need to be reassessed to determine if screening should resume. Some women have medical problems that increase the chance of getting cervical cancer. In these cases, your health care provider may recommend more frequent screening and Pap tests.  Colorectal Cancer  This type of cancer can be detected and often prevented.  Routine colorectal cancer screening usually begins at 74 years of age and continues through 74 years of age.  Your health care provider may recommend screening at an earlier age if you have risk factors for colon cancer.  Your health care provider may also recommend using home test kits to check for hidden blood in the stool.  A small camera at the end of a tube can be used to examine your colon directly (sigmoidoscopy or colonoscopy). This is done to check for the earliest forms of colorectal cancer.  Routine screening usually begins at age 25.  Direct examination of the colon should be repeated every 5-10 years through 74 years  of age. However, you may need to be screened more often if early forms of precancerous polyps or small growths are found.  Skin Cancer  Check your skin from head to toe regularly.  Tell your health care provider about any new moles or changes in moles, especially if there is a change in a mole's shape or color.  Also tell your health care provider if you have a mole that is larger than the size of a pencil eraser.  Always use sunscreen. Apply sunscreen liberally and repeatedly throughout the day.  Protect yourself by wearing long sleeves, pants, a wide-brimmed hat, and sunglasses whenever you are outside.  Heart disease, diabetes, and high blood pressure  High blood pressure causes heart disease and increases the risk of stroke. High blood pressure is more likely to develop in: ? People who have blood pressure in the high end of the normal range (130-139/85-89 mm Hg). ? People who are overweight or obese. ? People who are African American.  If you are 28-40 years of age, have your blood pressure checked every 3-5 years. If you are 61 years of age or older, have your blood pressure checked every year. You should have your blood pressure measured twice-once when you are at a hospital or clinic, and once when you are not at a hospital or clinic. Record the average of the two measurements. To check your blood pressure when you are not at a hospital or clinic, you can use: ? An automated blood pressure machine at a pharmacy. ? A home blood pressure monitor.  If you are between 27 years and 7 years old, ask your health care provider if you should take aspirin to prevent strokes.  Have regular diabetes screenings. This involves taking a blood sample to check your fasting blood sugar level. ? If you are at a normal weight and have a low risk for diabetes, have this test once every three years after 74 years of age. ? If you are overweight and have a high risk for diabetes, consider being tested  at a younger age or more often. Preventing infection Hepatitis B  If you have a higher risk for hepatitis B, you should be screened for this virus. You are considered at high risk for hepatitis B if: ? You were born in a country where hepatitis B is common. Ask your health care provider which countries are considered high risk. ? Your parents were born  in a high-risk country, and you have not been immunized against hepatitis B (hepatitis B vaccine). ? You have HIV or AIDS. ? You use needles to inject street drugs. ? You live with someone who has hepatitis B. ? You have had sex with someone who has hepatitis B. ? You get hemodialysis treatment. ? You take certain medicines for conditions, including cancer, organ transplantation, and autoimmune conditions.  Hepatitis C  Blood testing is recommended for: ? Everyone born from [birth year] through 1963. ? Anyone with known risk factors for hepatitis C.  Sexually transmitted infections (STIs)  You should be screened for sexually transmitted infections (STIs) including gonorrhea and chlamydia if: ? You are sexually active and are younger than 74 years of age. ? You are older than 75 years of age and your health care provider tells you that you are at risk for this type of infection. ? Your sexual activity has changed since you were last screened and you are at an increased risk for chlamydia or gonorrhea. Ask your health care provider if you are at risk.  If you do not have HIV, but are at risk, it may be recommended that you take a prescription medicine daily to prevent HIV infection. This is called pre-exposure prophylaxis (PrEP). You are considered at risk if: ? You are sexually active and do not regularly use condoms or know the HIV status of your partner(s). ? You take drugs by injection. ? You are sexually active with a partner who has HIV.  Talk with your health care provider about whether you are at high risk of being infected with HIV. If  you choose to begin PrEP, you should first be tested for HIV. You should then be tested every 3 months for as long as you are taking PrEP. Pregnancy  If you are premenopausal and you may become pregnant, ask your health care provider about preconception counseling.  If you may become pregnant, take 400 to 800 micrograms (mcg) of folic acid every day.  If you want to prevent pregnancy, talk to your health care provider about birth control (contraception). Osteoporosis and menopause  Osteoporosis is a disease in which the bones lose minerals and strength with aging. This can result in serious bone fractures. Your risk for osteoporosis can be identified using a bone density scan.  If you are 74 years of age or older, or if you are at risk for osteoporosis and fractures, ask your health care provider if you should be screened.  Ask your health care provider whether you should take a calcium or vitamin D supplement to lower your risk for osteoporosis.  Menopause may have certain physical symptoms and risks.  Hormone replacement therapy may reduce some of these symptoms and risks. Talk to your health care provider about whether hormone replacement therapy is right for you. Follow these instructions at home:  Schedule regular health, dental, and eye exams.  Stay current with your immunizations.  Do not use any tobacco products including cigarettes, chewing tobacco, or electronic cigarettes.  If you are pregnant, do not drink alcohol.  If you are breastfeeding, limit how much and how often you drink alcohol.  Limit alcohol intake to no more than 1 drink per day for nonpregnant women. One drink equals 12 ounces of beer, 5 ounces of wine, or 1 ounces of hard liquor.  Do not use street drugs.  Do not share needles.  Ask your health care provider for help if you need support or information about  quitting drugs.  Tell your health care provider if you often feel depressed.  Tell your  health care provider if you have ever been abused or do not feel safe at home. This information is not intended to replace advice given to you by your health care provider. Make sure you discuss any questions you have with your health care provider. Document Released: 03/31/2011 Document Revised: 02/21/2016 Document Reviewed: 06/19/2015 Elsevier Interactive Patient Education  [employer].

## 2017-05-07 NOTE — Progress Notes (Signed)
Subjective:    Patient ID: [name], female    DOB: Apr 13, 1941, 76 y.o.   MRN: [id number]  Chief Complaint  Patient presents with  . CPE    fasting    HPI:  [name] is a 76 y.o. female who presents today for a Medicare Annual Wellness/Physical exam.    1) Health Maintenance -    Diet - Averaging about 2-3 meals per day consisting of a regular diet; Caffeine intake of 1-2 cups occasionally.  Exercise - Walking about 3 miles per day averaging about 7,000 steps   2) Preventative Exams / Immunizations:  Dental -- Due for exam  Vision -- Up to date   Health Maintenance  Topic Date Due  . TETANUS/TDAP  03/23/1962  . COLONOSCOPY  03/23/1993  . PNA vac Low Risk Adult (1 of 2 - PCV13) 03/23/2008  . HEMOGLOBIN A1C  11/05/2016  . INFLUENZA VACCINE  04/29/2017  . FOOT EXAM  05/05/2017  . MAMMOGRAM  06/12/2017  . OPHTHALMOLOGY EXAM  02/06/2018  . DEXA SCAN  Completed       There is no immunization history on file for this patient.  RISK FACTORS  Tobacco History  Smoking Status  . Never Smoker  Smokeless Tobacco  . Never Used     Cardiac risk factors: advanced age (older than 87 for men, 23 for women), diabetes mellitus, hypertension and obesity (BMI >= 30 kg/m2).  Depression Screen  Depression screen [facility] 2/9 05/07/2017  Decreased Interest 0  Down, Depressed, Hopeless 0  PHQ - 2 Score 0     Activities of Daily Living In your present state of health, do you have any difficulty performing the following activities?:  Driving? No Managing money?  No Feeding yourself? No Getting from bed to chair? No Climbing a flight of stairs? No Preparing food and eating?: No Bathing or showering? No Getting dressed: No Getting to the toilet? No Using the toilet: No Moving around from place to place: No In the past year have you fallen or had a near fall?:No   Home Safety Has smoke detector and wears seat belts. No firearms. No excess sun  exposure. Are there smokers in your home (other than you)?  No Do you feel safe at home?  Yes  Hearing Difficulties: No Do you often ask people to speak up or repeat themselves? No Do you experience ringing or noises in your ears? No  Do you have difficulty understanding soft or whispered voices? No    Cognitive Testing  Alert? Yes   Normal Appearance? Yes  Oriented to person? Yes  Place? Yes   Time? Yes  Recall of three objects?  Yes  Can perform simple calculations? Yes  Displays appropriate judgment? Yes  Can read the correct time from a watch face? Yes  Do you feel that you have a problem with memory? No  Do you often misplace items? No   Advanced Directives have been discussed with the patient? Yes   Current Physicians/Providers and Suppliers  1. Marcos Eke, FNP - Internal Medicine  Indicate any recent Medical Services you may have received from other than [facility] providers in the past year (date may be approximate).  All answers were reviewed with the patient and necessary referrals were made:  Jeanine Luz, FNP   05/07/2017    Allergies  Allergen Reactions  . Sulfa Antibiotics   . Penicillins Hives, Swelling and Rash     Outpatient Medications Prior to Visit  Medication Sig Dispense Refill  . albuterol (PROVENTIL HFA;VENTOLIN HFA) 108 (90 Base) MCG/ACT inhaler Inhale 1-2 puffs into the lungs every 4 (four) hours as needed for wheezing or shortness of breath (or cough). 1 Inhaler 0  . ALPRAZolam (XANAX) 0.25 MG tablet Take 1 tablet (0.25 mg total) by mouth daily as needed for anxiety. 30 tablet 0  . aspirin EC 81 MG EC tablet Take 1 tablet (81 mg total) by mouth daily.    . calcium carbonate (OS-CAL - DOSED IN MG OF ELEMENTAL CALCIUM) 1250 MG tablet Take 2-3 tablets by mouth 2 (two) times daily. Takes 3  tablets in the morning and 2 tablets in the evening    . Lancet Devices (ACCU-CHEK SOFTCLIX) lancets Use as 1 lancet to test to check blood sugars 2 time daily 1  each 3  . metFORMIN (GLUCOPHAGE) 1000 MG tablet Take 1,000 mg by mouth 2 (two) times daily with a meal.    . Multiple Vitamin (MULTIVITAMIN WITH MINERALS) TABS Take 1 tablet by mouth daily.    . Olmesartan-Amlodipine-HCTZ 40-5-12.5 MG TABS TAKE 1 TABLET BY MOUTH EVERY DAY 30 tablet 0  . pravastatin (PRAVACHOL) 20 MG tablet Take 1 tablet (20 mg total) by mouth daily. 30 tablet 0  . vitamin E 400 UNIT capsule Take 400 Units by mouth daily.     No facility-administered medications prior to visit.      Past Medical History:  Diagnosis Date  . Allergy   . Diabetes mellitus without complication (HCC)   . Hypercholesteremia   . Hypertension   . Urine incontinence      Past Surgical History:  Procedure Laterality Date  . CHOLECYSTECTOMY    . TUBAL LIGATION       Family History  Problem Relation Age of Onset  . Arthritis Mother   . Hyperlipidemia Mother   . Heart disease Mother   . Hypertension Mother   . Diabetes Mother   . Healthy Father   . Diabetes Other   . Hypertension Other      Social History   Social History  . Marital status: Widowed    Spouse name: N/A  . Number of children: 6  . Years of education: 9   Occupational History  . Retired    Social History Main Topics  . Smoking status: Never Smoker  . Smokeless tobacco: Never Used  . Alcohol use No  . Drug use: No  . Sexual activity: Not on file   Other Topics Concern  . Not on file   Social History Narrative   Denies abuse and feels safe at home.   Denies religious beliefs effecting health care.      Review of Systems  Constitutional: Denies fever, chills, fatigue, or significant weight gain/loss. HENT: Head: Denies headache or neck pain Ears: Denies changes in hearing, ringing in ears, earache, drainage Nose: Denies discharge, stuffiness, itching, nosebleed, sinus pain Throat: Denies sore throat, hoarseness, dry mouth, sores, thrush Eyes: Denies loss/changes in vision, pain, redness,  blurry/double vision, flashing lights Cardiovascular: Denies chest pain/discomfort, tightness, palpitations, shortness of breath with activity, difficulty lying down, swelling, sudden awakening with shortness of breath Respiratory: Denies shortness of breath, cough, sputum production, wheezing Gastrointestinal: Denies dysphasia, heartburn, change in appetite, nausea, change in bowel habits, rectal bleeding, constipation, diarrhea, yellow skin or eyes Genitourinary: Denies frequency, urgency, burning/pain, blood in urine, incontinence, change in urinary strength. Musculoskeletal: Denies muscle/joint pain, stiffness, back pain, redness or swelling of joints, trauma Skin: Denies rashes,  lumps, itching, dryness, color changes, or hair/nail changes Neurological: Denies dizziness, fainting, seizures, weakness, numbness, tingling, tremor Psychiatric - Denies nervousness, stress, depression or memory loss Endocrine: Denies heat or cold intolerance, sweating, frequent urination, excessive thirst, changes in appetite Hematologic: Denies ease of bruising or bleeding    Objective:     BP 132/70 (BP Location: Left Arm, Patient Position: Sitting, Cuff Size: Normal)   Pulse 75   Temp 98.6 F (37 C) (Oral)   Resp 16   Ht 5\' 5"  (1.651 m)   Wt 184 lb (83.5 kg)   SpO2 97%   BMI 30.62 kg/m  Nursing note and vital signs reviewed.  Physical Exam  Constitutional: She is oriented to person, place, and time. She appears well-developed and well-nourished.  HENT:  Head: Normocephalic.  Right Ear: Hearing, tympanic membrane, external ear and ear canal normal.  Left Ear: Hearing, tympanic membrane, external ear and ear canal normal.  Nose: Nose normal.  Mouth/Throat: Uvula is midline, oropharynx is clear and moist and mucous membranes are normal.  Eyes: Pupils are equal, round, and reactive to light. Conjunctivae and EOM are normal.  Neck: Neck supple. No JVD present. No tracheal deviation present. No  thyromegaly present.  Cardiovascular: Normal rate, regular rhythm, normal heart sounds and intact distal pulses.   Pulmonary/Chest: Effort normal and breath sounds normal.  Abdominal: Soft. Bowel sounds are normal. She exhibits no distension and no mass. There is no tenderness. There is no rebound and no guarding.  Musculoskeletal: Normal range of motion. She exhibits no edema or tenderness.  Lymphadenopathy:    She has no cervical adenopathy.  Neurological: She is alert and oriented to person, place, and time. She has normal reflexes. No cranial nerve deficit. She exhibits normal muscle tone. Coordination normal.  Skin: Skin is warm and dry.  Psychiatric: She has a normal mood and affect. Her behavior is normal. Judgment and thought content normal.       Assessment & Plan:   During the course of the visit the patient was educated and counseled about appropriate screening and preventive services including:    Influenza vaccine  Colorectal cancer screening  Diabetes screening  Glaucoma screening  Nutrition counseling   Diet review for nutrition referral? Yes ____  Not Indicated _X___   Patient Instructions (the written plan) was given to the patient.  Medicare Attestation I have personally reviewed: The patient's medical and social history Their use of alcohol, tobacco or illicit drugs Their current medications and supplements The patient's functional ability including ADLs,fall risks, home safety risks, cognitive, and hearing and visual impairment Diet and physical activities Evidence for depression or mood disorders  The patient's weight, height, BMI,  have been recorded in the chart.  I have made referrals, counseling, and provided education to the patient based on review of the above and I have provided the patient with a written personalized care plan for preventive services.     Problem List Items Addressed This Visit      Cardiovascular and Mediastinum    Essential hypertension    Blood pressure well-controlled below goal 140/90 with current medication regimen and no adverse side effects. Continue current dosage of Tribenzor. Monitor blood pressure, and follow low-sodium diet.      Relevant Orders   Ambulatory referral to Cardiology     Endocrine   Type 2 diabetes mellitus, controlled (HCC)    Type 2 diabetes previously well controlled with current medication regimen and no adverse side effects. Obtain hemoglobin  A1c and urine microalbumin. Diabetic foot exam completed today. Declines Pneumovax. Maintained on pravastatin and olmesartan for CAD risk reduction. Continue current dosage of metformin pending A1c results.        Other   Medicare annual wellness visit, subsequent - Primary    Reviewed and updated patient's medical, surgical, family and social history. Medications and allergies were also reviewed. Basic screenings for depression, activities of daily living, hearing, cognition and safety were performed. Provider list was updated and health plan was provided to the patient.       Routine general medical examination at a health care facility    1) Anticipatory Guidance: Discussed importance of wearing a seatbelt while driving and not texting while driving; changing batteries in smoke detector at least once annually; wearing suntan lotion when outside; eating a balanced and moderate diet; getting physical activity at least 30 minutes per day.  2) Immunizations / Screenings / Labs:  Declines tetanus and pneumococcal vaccinations. All other immunizations are up-to-date per recommendations. Due for a colon cancer screening and will check on the price of Cologuard. Breast cancer screening is up-to-date per recommendations. Due for a dental exam encouraged to be completed independently. All other screenings are up-to-date per recommendations. Obtain CBC, CMET, and lipid profile.    Overall well exam with risk factors for cardiovascular  disease including type 2 diabetes, obesity, and hypertension. Recommend weight loss of 5-10% of current body weight through nutrition and physical activity changes. Chronic conditions appear adequately controlled with current medication regimens and no adverse side effects. Continue other healthy lifestyle behaviors and choices. Follow-up prevention exam in 1 year. Follow-up office visit pending blood work and for chronic conditions.       Relevant Orders   CBC (Completed)   Comprehensive metabolic panel (Completed)   Lipid panel (Completed)   Hemoglobin A1c (Completed)   Urine Microalbumin w/creat. ratio (Completed)       I am having [name] maintain her multivitamin with minerals, calcium carbonate, vitamin E, metFORMIN, aspirin, albuterol, accu-chek softclix, ALPRAZolam, pravastatin, and Olmesartan-Amlodipine-HCTZ.   Follow-up: Return in about 6 months (around 11/07/2017), or if symptoms worsen or fail to improve.   Jeanine Luz, FNP

## 2017-05-07 NOTE — Assessment & Plan Note (Signed)
1) Anticipatory Guidance: Discussed importance of wearing a seatbelt while driving and not texting while driving; changing batteries in smoke detector at least once annually; wearing suntan lotion when outside; eating a balanced and moderate diet; getting physical activity at least 30 minutes per day.  2) Immunizations / Screenings / Labs:  Declines tetanus and pneumococcal vaccinations. All other immunizations are up-to-date per recommendations. Due for a colon cancer screening and will check on the price of Cologuard. Breast cancer screening is up-to-date per recommendations. Due for a dental exam encouraged to be completed independently. All other screenings are up-to-date per recommendations. Obtain CBC, CMET, and lipid profile.    Overall well exam with risk factors for cardiovascular disease including type 2 diabetes, obesity, and hypertension. Recommend weight loss of 5-10% of current body weight through nutrition and physical activity changes. Chronic conditions appear adequately controlled with current medication regimens and no adverse side effects. Continue other healthy lifestyle behaviors and choices. Follow-up prevention exam in 1 year. Follow-up office visit pending blood work and for chronic conditions.

## 2017-05-07 NOTE — Assessment & Plan Note (Signed)
Blood pressure well-controlled below goal 140/90 with current medication regimen and no adverse side effects. Continue current dosage of Tribenzor. Monitor blood pressure, and follow low-sodium diet.

## 2017-05-15 ENCOUNTER — Encounter: Payer: Self-pay | Admitting: *Deleted

## 2017-06-08 ENCOUNTER — Telehealth: Payer: Self-pay | Admitting: Family

## 2017-06-08 NOTE — Telephone Encounter (Signed)
The pts AccuCheck machine will no longer work. She would like a new machine. [facility] [address].

## 2017-06-09 ENCOUNTER — Encounter: Payer: Self-pay | Admitting: Physician Assistant

## 2017-06-09 ENCOUNTER — Ambulatory Visit (INDEPENDENT_AMBULATORY_CARE_PROVIDER_SITE_OTHER): Payer: Medicare Other | Admitting: Physician Assistant

## 2017-06-09 VITALS — BP 134/70 | HR 68 | Ht 65.0 in | Wt 181.0 lb

## 2017-06-09 DIAGNOSIS — E6609 Other obesity due to excess calories: Secondary | ICD-10-CM

## 2017-06-09 DIAGNOSIS — I10 Essential (primary) hypertension: Secondary | ICD-10-CM

## 2017-06-09 DIAGNOSIS — E119 Type 2 diabetes mellitus without complications: Secondary | ICD-10-CM | POA: Diagnosis not present

## 2017-06-09 DIAGNOSIS — Z6830 Body mass index (BMI) 30.0-30.9, adult: Secondary | ICD-10-CM

## 2017-06-09 DIAGNOSIS — R079 Chest pain, unspecified: Secondary | ICD-10-CM

## 2017-06-09 DIAGNOSIS — E785 Hyperlipidemia, unspecified: Secondary | ICD-10-CM

## 2017-06-09 MED ORDER — BLOOD GLUCOSE MONITOR KIT: PACK | 0 refills | Status: AC

## 2017-06-09 MED ORDER — BLOOD GLUCOSE MONITOR KIT: PACK | 0 refills | Status: DC

## 2017-06-09 NOTE — Progress Notes (Signed)
Cardiology Office Note    Date:  06/09/2017   ID:  Leamon Arntdlay K Mcmeekin, DOB Feb 20, 1941, MRN [id number]  PCP:  Veryl Speakalone, Gregory D, FNP  Cardiologist: New  Chief Complaint  Patient presents with  . New Patient (Initial Visit)    History of Present Illness:    [name] is a 76 y.o. female who is being seen today for the evaluation of shortness of breath at the request of Veryl Speakalone, Gregory D, FNP.  Patient has history of hypertension, diabetes, HLD, obesity, and family history of CAD-mother died of MI 74 yo.. Labs reviewed from 05/07/17 cholesterol 296, HDL 48, LDL 229 triglycerides 94 which was improved from a year ago. Patient on Pravachol 20 mg daily.  Last month when getting up she was short of breath and had chest pressure that lasted 3-4 min followed by needle sticking. Didn't feel well most of the day but got up the next day feeling great. Walks 3 miles 5 days/week without any symptoms. She says she feels fantastic. Admitted with chest pain in 2013 and had normal nuclear stress test by Dr. Sharyn LullHarwani.Also normal lexiscan in 2015.Patient stopped taking Pravachol because of muscle weakness. She failed Lipitor as well. Smoked 15 years but quit 30-40 years ago   Past Medical History:  Diagnosis Date  . Allergy   . Diabetes mellitus without complication (HCC)   . Hypercholesteremia   . Hypertension   . Urine incontinence     Past Surgical History:  Procedure Laterality Date  . CHOLECYSTECTOMY    . TUBAL LIGATION      Current Medications: Current Meds  Medication Sig  . albuterol (PROVENTIL HFA;VENTOLIN HFA) 108 (90 Base) MCG/ACT inhaler Inhale 1-2 puffs into the lungs every 4 (four) hours as needed for wheezing or shortness of breath (or cough).  . ALPRAZolam (XANAX) 0.25 MG tablet Take 1 tablet (0.25 mg total) by mouth daily as needed for anxiety.  Marland Kitchen. aspirin EC 81 MG EC tablet Take 1 tablet (81 mg total) by mouth daily.  . calcium carbonate (OS-CAL - DOSED IN MG OF  ELEMENTAL CALCIUM) 1250 MG tablet Take 2-3 tablets by mouth 2 (two) times daily. Takes 3  tablets in the morning and 2 tablets in the evening  . metFORMIN (GLUCOPHAGE) 1000 MG tablet Take 1,000 mg by mouth 2 (two) times daily with a meal.  . Multiple Vitamin (MULTIVITAMIN WITH MINERALS) TABS Take 1 tablet by mouth daily.  . Olmesartan-Amlodipine-HCTZ 40-5-12.5 MG TABS TAKE 1 TABLET BY MOUTH EVERY DAY  . vitamin E 400 UNIT capsule Take 400 Units by mouth daily.     Allergies:   Sulfa antibiotics and Penicillins   Social History   Social History  . Marital status: Widowed    Spouse name: N/A  . Number of children: 6  . Years of education: 10012   Occupational History  . Retired    Social History Main Topics  . Smoking status: Never Smoker  . Smokeless tobacco: Never Used  . Alcohol use No  . Drug use: No  . Sexual activity: Not Asked   Other Topics Concern  . None   Social History Narrative   Denies abuse and feels safe at home.   Denies religious beliefs effecting health care.      Family History:  The patient's family history includes Arthritis in her mother; Diabetes in her mother and other; Healthy in her father; Heart disease in her mother; Hyperlipidemia in her mother; Hypertension in her mother and  other.   ROS:   Please see the history of present illness.    Review of Systems  Constitution: Positive for decreased appetite.  HENT: Negative.   Eyes: Negative.   Cardiovascular: Positive for chest pain.  Respiratory: Negative.   Hematologic/Lymphatic: Negative.   Musculoskeletal: Negative.  Negative for joint pain.  Gastrointestinal: Negative.   Genitourinary: Negative.   Neurological: Negative.   Psychiatric/Behavioral: The patient is nervous/anxious.    All other systems reviewed and are negative.   PHYSICAL EXAM:   VS:  BP 134/70   Pulse 68   Ht  (1.651 m)   Wt 181 lb (82.1 kg)   SpO2 97%   BMI 30.12 kg/m   Physical Exam  GEN: Well nourished, well  developed, in no acute distress  Neck: no JVD, carotid bruits, or masses Cardiac:RRR; no murmurs, rubs, or gallops  Respiratory:  clear to auscultation bilaterally, normal work of breathing GI: soft, nontender, nondistended, + BS Ext: without cyanosis, clubbing, or edema, Good distal pulses bilaterally MS: no deformity or atrophy  Skin: warm and dry, no rash Neuro:  Alert and Oriented x 3 Psych: euthymic mood, full affect  Wt Readings from Last 3 Encounters:  06/09/17 181 lb (82.1 kg)  05/07/17 184 lb (83.5 kg)  05/05/16 199 lb (90.3 kg)      Studies/Labs Reviewed:   EKG:  EKG is ordered today.  The ekg ordered today demonstrates NSR, normal EKG  Recent Labs: 05/07/2017: ALT 11; BUN 16; Creatinine, Ser 0.96; Hemoglobin 12.6; Platelets 265.0; Potassium 4.0; Sodium 140   Lipid Panel    Component Value Date/Time   CHOL 296 (H) 05/07/2017 1010   TRIG 94.0 05/07/2017 1010   HDL 48.50 05/07/2017 1010   CHOLHDL 6 05/07/2017 1010   VLDL 18.8 05/07/2017 1010   LDLCALC 229 (H) 05/07/2017 1010    Additional studies/ records that were reviewed today include:  lexisIcan MPRESSION: 1. No reversible ischemia or infarction. Inferior wall diaphragmatic attenuation.   2. Normal left ventricular wall motion.   3. Left ventricular ejection fraction 70%   4. Low-risk stress test findings*.    can 2015    ASSESSMENT:    1. Chest pain, unspecified type   2. Essential hypertension   3. Hyperlipidemia, unspecified hyperlipidemia type   4. Controlled type 2 diabetes mellitus without complication, without long-term current use of insulin (HCC)   5. Class 1 obesity due to excess calories with serious comorbidity and body mass index (BMI) of 30.0 to 30.9 in adult      PLAN:  In order of problems listed above:  Chest pain only had one episode one month ago that was short-lived when she first got up in the morning. Now walking 3 miles a day without symptoms. Had negative stress test  in 2013 in 2015. Does have multiple cardiac risk factors for CAD including untreated hyperlipidemia. Discussed with Dr.Nahser who concurs that patient needs stress Myoview. The stress test normal, follow-up with Dr. Delton See in 1 year.  Essential hypertension controlled with olmesartan- amlodipine HCTZ  Hyperlipidemia failed to statins. Levels quite high. We'll referred to our lipid clinic for possible PCS K9 inhibitor.  Diabetes mellitus type 2 on metformin managed by primary care  Obesity weight loss program recommended. Patient is walking 3 miles a day but needs to reduce her calorie intake.    Medication Adjustments/Labs and Tests Ordered: Current medicines are reviewed at length with the patient today.  Concerns regarding medicines are outlined above.  Medication changes, Labs and Tests ordered today are listed in the Patient Instructions below. Patient Instructions  Medication Instructions:  Your physician recommends that you continue on your current medications as directed. Please refer to the Current Medication list given to you today.  Labwork: None ordered  Testing/Procedures: Your physician has requested that you have en exercise stress myoview WITHIN 1 WEEK.   For further information please visit [email or web address] Please follow instruction sheet, as given.    Follow-Up: You have been referred to:  1st AVAILABLE IN THE LIPID CLINIC TO DISCUSS PSK9 INHIBITOR  Your physician recommends that you schedule a follow-up appointment in: 06/22/17 ARRIVE AT 11:00 TO SEE Manus Weedman, PA-C  Your physician recommends that you schedule a follow-up appointment in: 1ST AVAILABLE WITH DR. Delton See   Any Other Special Instructions Will Be Listed Below (If Applicable).       Elson Clan, PA-C  06/09/2017 10:00 AM    [facility] [address] [place], [place], [place]  [address] Phone: [phone]; Fax: [phone]

## 2017-06-09 NOTE — Patient Instructions (Addendum)
Medication Instructions:  Your physician recommends that you continue on your current medications as directed. Please refer to the Current Medication list given to you today.  Labwork: None ordered  Testing/Procedures: Your physician has requested that you have en exercise stress myoview WITHIN 1 WEEK.   For further information please visit [email or web address]. Please follow instruction sheet, as given.    Follow-Up: You have been referred to:  1st AVAILABLE IN THE LIPID CLINIC TO DISCUSS PSK9 INHIBITOR  Your physician recommends that you schedule a follow-up appointment in: 06/22/17 ARRIVE AT 11:00 TO SEE MICHELE LENZE, PA-C  Your physician recommends that you schedule a follow-up appointment in: 1ST AVAILABLE WITH DR. Delton SeeNELSON   Any Other Special Instructions Will Be Listed Below (If Applicable).

## 2017-06-09 NOTE — Telephone Encounter (Signed)
Glucometer faxed over to pharmacy.

## 2017-06-15 ENCOUNTER — Telehealth (HOSPITAL_COMMUNITY): Payer: Self-pay | Admitting: *Deleted

## 2017-06-15 NOTE — Telephone Encounter (Signed)
Patient given detailed instructions per Myocardial Perfusion Study Information Sheet for the test on 06/17/17 at 0745. Patient notified to arrive 15 minutes early and that it is imperative to arrive on time for appointment to keep from having the test rescheduled.  If you need to cancel or reschedule your appointment, please call the office within 24 hours of your appointment. . Patient verbalized understanding.Kjirsten Bloodgood W    

## 2017-06-17 ENCOUNTER — Ambulatory Visit (HOSPITAL_COMMUNITY): Payer: Medicare Other | Attending: Cardiovascular Disease

## 2017-06-17 DIAGNOSIS — R079 Chest pain, unspecified: Secondary | ICD-10-CM | POA: Insufficient documentation

## 2017-06-17 LAB — MYOCARDIAL PERFUSION IMAGING
CHL CUP MPHR: 146 {beats}/min
CHL CUP RESTING HR STRESS: 61 {beats}/min
CHL RATE OF PERCEIVED EXERTION: 18
CSEPEDS: 1 s
CSEPPHR: 142 {beats}/min
Estimated workload: 7 METS
Exercise duration (min): 5 min
LV sys vol: 13 mL
LVDIAVOL: 74 mL (ref 46–106)
Percent HR: 97 %
RATE: 0.26
SDS: 1
SRS: 8
SSS: 9
TID: 0.86

## 2017-06-17 MED ORDER — TECHNETIUM TC 99M TETROFOSMIN IV KIT
31.0000 | PACK | Freq: Once | INTRAVENOUS | Status: AC | PRN
  Administered 2017-06-17: 31 via INTRAVENOUS
  Filled 2017-06-17: qty 31

## 2017-06-17 MED ORDER — TECHNETIUM TC 99M TETROFOSMIN IV KIT
10.1000 | PACK | Freq: Once | INTRAVENOUS | Status: AC | PRN
  Administered 2017-06-17: 10.1 via INTRAVENOUS
  Filled 2017-06-17: qty 11

## 2017-06-22 ENCOUNTER — Telehealth: Payer: Self-pay | Admitting: *Deleted

## 2017-06-22 ENCOUNTER — Ambulatory Visit: Payer: Medicare Other | Admitting: Physician Assistant

## 2017-06-22 NOTE — Telephone Encounter (Signed)
-----   Message from Michele M Lenze, PA-C sent at 06/22/2017  8:03 AM EDT ----- Normal stress test. F/u with Dr. Nelson in 1 yr. 

## 2017-06-22 NOTE — Progress Notes (Deleted)
Cardiology Office Note    Date:  06/22/2017   ID:  [name], DOB July 07, 1941, MRN [id number]  PCP:  Patient, No Pcp Per  Cardiologist:   No chief complaint on file.   History of Present Illness:  [name] is a 76 y.o. female who I saw 06/09/17 for atypical chest pain. She had history of negative stress test in 2013 and 2015. She was walking 3 miles a day without symptoms. She has multiple cardiac risk factors for CAD including HLD that is untreated, hypertension, diabetes, and obesity. She was referred to our lipid clinic for evaluation of PCSK9 inhibitor.    Past Medical History:  Diagnosis Date  . Allergy   . Diabetes mellitus without complication (HCC)   . Hypercholesteremia   . Hypertension   . Urine incontinence     Past Surgical History:  Procedure Laterality Date  . CHOLECYSTECTOMY    . TUBAL LIGATION      Current Medications: No outpatient prescriptions have been marked as taking for the 06/22/17 encounter (Appointment) with Dyann Kief, PA-C.     Allergies:   Sulfa antibiotics and Penicillins   Social History   Social History  . Marital status: Widowed    Spouse name: N/A  . Number of children: 6  . Years of education: 49   Occupational History  . Retired    Social History Main Topics  . Smoking status: Never Smoker  . Smokeless tobacco: Never Used  . Alcohol use No  . Drug use: No  . Sexual activity: Not on file   Other Topics Concern  . Not on file   Social History Narrative   Denies abuse and feels safe at home.   Denies religious beliefs effecting health care.      Family History:  The patient's ***family history includes Arthritis in her mother; Diabetes in her mother and other; Healthy in her father; Heart disease in her mother; Hyperlipidemia in her mother; Hypertension in her mother and other.   ROS:   Please see the history of present illness.    ROS All other systems reviewed and are negative.   PHYSICAL  EXAM:   VS:  There were no vitals taken for this visit.  Physical Exam  GEN: Well nourished, well developed, in no acute distress  HEENT: normal  Neck: no JVD, carotid bruits, or masses Cardiac:RRR; no murmurs, rubs, or gallops  Respiratory:  clear to auscultation bilaterally, normal work of breathing GI: soft, nontender, nondistended, + BS Ext: without cyanosis, clubbing, or edema, Good distal pulses bilaterally MS: no deformity or atrophy  Skin: warm and dry, no rash Neuro:  Alert and Oriented x 3, Strength and sensation are intact Psych: euthymic mood, full affect  Wt Readings from Last 3 Encounters:  06/09/17 181 lb (82.1 kg)  05/07/17 184 lb (83.5 kg)  05/05/16 199 lb (90.3 kg)      Studies/Labs Reviewed:   EKG:  EKG is*** ordered today.  The ekg ordered today demonstrates ***  Recent Labs: 05/07/2017: ALT 11; BUN 16; Creatinine, Ser 0.96; Hemoglobin 12.6; Platelets 265.0; Potassium 4.0; Sodium 140   Lipid Panel    Component Value Date/Time   CHOL 296 (H) 05/07/2017 1010   TRIG 94.0 05/07/2017 1010   HDL 48.50 05/07/2017 1010   CHOLHDL 6 05/07/2017 1010   VLDL 18.8 05/07/2017 1010   LDLCALC 229 (H) 05/07/2017 1010    Additional studies/ records that were reviewed today include:   Nuclear  stress test 06/17/17 Study Highlights   Nuclear stress EF: 82%.  Blood pressure demonstrated a normal response to exercise.  There was no ST segment deviation noted during stress.  The study is normal.  This is a low risk study.  The left ventricular ejection fraction is normal (55-65%).       ASSESSMENT:    No diagnosis found.   PLAN:  In order of problems listed above:      Medication Adjustments/Labs and Tests Ordered: Current medicines are reviewed at length with the patient today.  Concerns regarding medicines are outlined above.  Medication changes, Labs and Tests ordered today are listed in the Patient Instructions below. There are no Patient  Instructions on file for this visit.   Elson Clan, PA-C  06/22/2017 10:37 AM    [facility] [address] [place], [place], [place]  [address] Phone: [phone]; Fax: [phone]

## 2017-06-22 NOTE — Telephone Encounter (Signed)
-----   Message from Dyann Kief, PA-C sent at 06/22/2017  8:03 AM EDT ----- Normal stress test. F/u with Dr. Delton See in 1 yr.

## 2017-06-22 NOTE — Telephone Encounter (Signed)
Left message to go over Myoview results.  

## 2017-06-25 NOTE — Progress Notes (Signed)
Patient ID: [name]                 DOB: 26-May-1941                    MRN: [id number]     HPI: [name] is a 76 y.o. female patient referred to lipid clinic by Ermalinda Barrios, PA. PMH is significant for HTN, DM, HLD, obesity, and family history of CAD. Pt has a history of statin intolerance and presents to lipid clinic for further management.  Pt presents today in good spirits. She has previously tried taking Lipitor, lovastatin, and most recently pravastatin. She would experience muscle cramps in her left arm with each statin. Symptoms presented 3 days after starting therapy and would take a few weeks to resolve after statin discontinuation.  She stays active walking multiple miles 5 days a week. She does admit to some dietary indiscretion - she eats most of her meals out and does like fried food and sweet tea.  Current Medications: none Intolerances: pravastatin '20mg'$  daily, Lipitor '20mg'$  daily, lovastatin - myalgias Risk Factors: HTN, DM, obesity, LDL > 190, family history of CAD LDL goal: '100mg'$ /dL for primary prevention with risk factors  Diet: Does not have much of an appetite. Likes soup, does not eat much meat. Eats out at restaurants frequently. Likes cheese. Looks for low fat ice cream. Does like sweet tea 2-3x per day.  Exercise: Walks 3 miles (45 minutes) 5 days per week.  Family History: The patient's family history includes Arthritis in her mother; Diabetes in her mother and other; Healthy in her father; Heart disease in her mother; Hyperlipidemia in her mother; Hypertension in her mother and other. Mother died from an MI at age 42.  Social History: Pt smoked for 15 years but quit 30-40 years ago. Denies alcohol and illicit drug use.  Labs: 05/07/17: TC 296, TG 94, HDL 48, LDL 229 (no lipid lowering therapy)  Past Medical History:  Diagnosis Date  . Allergy   . Diabetes mellitus without complication ([place])   . Hypercholesteremia   . Hypertension   . Urine  incontinence     Current Outpatient Prescriptions on File Prior to Visit  Medication Sig Dispense Refill  . albuterol (PROVENTIL HFA;VENTOLIN HFA) 108 (90 Base) MCG/ACT inhaler Inhale 1-2 puffs into the lungs every 4 (four) hours as needed for wheezing or shortness of breath (or cough). 1 Inhaler 0  . ALPRAZolam (XANAX) 0.25 MG tablet Take 1 tablet (0.25 mg total) by mouth daily as needed for anxiety. 30 tablet 0  . aspirin EC 81 MG EC tablet Take 1 tablet (81 mg total) by mouth daily.    . blood glucose meter kit and supplies KIT Dispense based on patient and insurance preference. Use up to four times daily as directed. Dx code: E11.9 1 each 0  . calcium carbonate (OS-CAL - DOSED IN MG OF ELEMENTAL CALCIUM) 1250 MG tablet Take 2-3 tablets by mouth 2 (two) times daily. Takes 3  tablets in the morning and 2 tablets in the evening    . metFORMIN (GLUCOPHAGE) 1000 MG tablet Take 1,000 mg by mouth 2 (two) times daily with a meal.    . Multiple Vitamin (MULTIVITAMIN WITH MINERALS) TABS Take 1 tablet by mouth daily.    . Olmesartan-Amlodipine-HCTZ 40-5-12.5 MG TABS TAKE 1 TABLET BY MOUTH EVERY DAY 30 tablet 0  . pravastatin (PRAVACHOL) 40 MG tablet Take 0.5 tablets (20 mg total) by mouth daily. (  Patient not taking: Reported on 06/09/2017) 30 tablet 1  . vitamin E 400 UNIT capsule Take 400 Units by mouth daily.     No current facility-administered medications on file prior to visit.     Allergies  Allergen Reactions  . Sulfa Antibiotics   . Penicillins Hives, Swelling and Rash    Assessment/Plan:  1. Hyperlipidemia - Baseline LDL 229 is far above goal < '100mg'$ /dL for primary prevention with risk factors (DM and family history of CAD). Pt is intolerant to pravastatin, Lipitor, and lovastatin. Unfortunately pt will not qualify for PCSK9i since she is primary prevention. Discussed rechallenging with low dose statin therapy and pt is agreeable to this. Will start Crestor '5mg'$  daily. She will work to cut  back on fried foods and sweet tea. Advised pt to call clinic in 1 month with tolerability. Can titrate dose pending pt tolerability and can consider starting Zetia or referring to CLEAR trial if LDL remains above goal on Crestor.  Megan E. Supple, PharmD, CPP, [place] [address] [place], [place] [address] Phone: [phone]; Fax: [phone] 06/26/2017 11:00 AM

## 2017-06-26 ENCOUNTER — Ambulatory Visit (INDEPENDENT_AMBULATORY_CARE_PROVIDER_SITE_OTHER): Payer: Medicare Other | Admitting: Pharmacist

## 2017-06-26 DIAGNOSIS — E782 Mixed hyperlipidemia: Secondary | ICD-10-CM | POA: Diagnosis not present

## 2017-06-26 MED ORDER — ROSUVASTATIN CALCIUM 5 MG PO TABS: 5.0000 mg | ORAL_TABLET | Freq: Every day | ORAL | 11 refills | Status: DC

## 2017-06-26 NOTE — Patient Instructions (Signed)
It was nice to meet you today  Start taking Crestor (rosuvastatin)  daily for your cholesterol  Call Alanya Vukelich in the lipid clinic in 1 month with tolerability [phone]  Try to work on cutting back on sweet tea and fried food

## 2017-07-06 ENCOUNTER — Other Ambulatory Visit: Payer: Self-pay

## 2017-07-06 MED ORDER — ONETOUCH DELICA LANCETS FINE MISC: 12 refills | Status: AC

## 2017-07-06 MED ORDER — GLUCOSE BLOOD VI STRP: ORAL_STRIP | 12 refills | Status: AC

## 2017-07-06 NOTE — Telephone Encounter (Signed)
Faxed Onetouch Verio Strips for qid testing Dx: E11.9 #100+12/thx dmf

## 2017-07-06 NOTE — Addendum Note (Signed)
Addended by: Lerry Liner on: 07/06/2017 10:16 AM   Modules accepted: Orders

## 2017-07-06 NOTE — Telephone Encounter (Signed)
Also faxed Onetouch Delica Lancets for qid testing #100+12/thx dmf

## 2017-07-08 ENCOUNTER — Encounter: Payer: Self-pay | Admitting: Physician Assistant

## 2017-08-03 ENCOUNTER — Telehealth: Payer: Self-pay | Admitting: Pharmacist

## 2017-08-03 NOTE — Telephone Encounter (Signed)
LMOM for pt to return call to discuss Crestor tolerability.

## 2017-08-14 ENCOUNTER — Ambulatory Visit: Payer: Medicare Other | Admitting: Cardiology

## 2017-08-17 NOTE — Telephone Encounter (Signed)
LMOM again. 

## 2017-08-28 NOTE — Telephone Encounter (Signed)
LMOM again for pt. Since this is my 3rd attempt reaching out to pt, will make no further attempts.  If pt returns call to clinic, options for lipid management including increasing dose of Crestor if pt is tolerating well, starting Zetia, or referring to CLEAR clinical trial (primary prevention with DM).

## 2019-06-21 IMAGING — NM NM MISC PROCEDURE
3 series · 18 of 18 positions shown · non-contrast
Comparison: none

[Series 1: wbr_s-proj_st stress_(id)_sa · 6.5mm · 6.51mm/px · 6 of 512 frames shown (1 of 2)]
[frame 43/512]
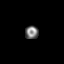
[frame 128/512]
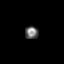
[frame 214/512]
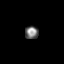
[frame 299/512]
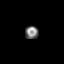
[frame 384/512]
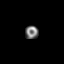
[frame 470/512]
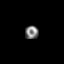

[Series 1: wbr_r-proj_st rest_(id)_sa · 6.5mm · 6.51mm/px · 6 of 64 frames shown]
[frame 6/64]
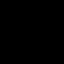
[frame 16/64]
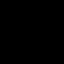
[frame 27/64]
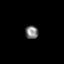
[frame 38/64]
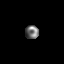
[frame 48/64]
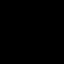
[frame 59/64]
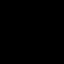

[Series 1: wbr_s-proj_st stress_(id)_sa · 6.5mm · 6.51mm/px · 6 of 64 frames shown (2 of 2)]
[frame 6/64]
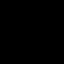
[frame 16/64]
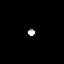
[frame 27/64]
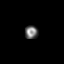
[frame 38/64]
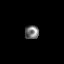
[frame 48/64]
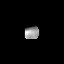
[frame 59/64]
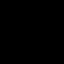

[18 of 18 positions shown; findings below may reference images not displayed]

Canned report from images found in remote index.

Refer to host system for actual result text.

## 2020-01-18 ENCOUNTER — Encounter: Payer: Self-pay | Admitting: General Practice

## 2022-03-07 ENCOUNTER — Ambulatory Visit (INDEPENDENT_AMBULATORY_CARE_PROVIDER_SITE_OTHER): Payer: Medicare Other | Admitting: Podiatry

## 2022-03-07 ENCOUNTER — Ambulatory Visit (INDEPENDENT_AMBULATORY_CARE_PROVIDER_SITE_OTHER): Payer: Medicare Other

## 2022-03-07 DIAGNOSIS — M722 Plantar fascial fibromatosis: Secondary | ICD-10-CM

## 2022-03-11 ENCOUNTER — Encounter: Payer: Self-pay | Admitting: Podiatry

## 2022-03-11 NOTE — Progress Notes (Signed)
Subjective:  Patient ID: [name], female    DOB: 05/20/1941,  MRN: [id number]  No chief complaint on file.   81 y.o. female presents with the above complaint.  Patient presents with complaint left heel pain going on for 3 weeks sharp pain in the heel.  Hurts with taking for step in the morning.  Hurts with ambulation.  She would like to discuss treatment options for this.  She has not seen anyone as prior to seeing me.  She denies any other acute complaints.  She is a diabetic with last A1c of 6.0   Review of Systems: Negative except as noted in the HPI. Denies N/V/F/Ch.  Past Medical History:  Diagnosis Date   Allergy    Diabetes mellitus without complication (HCC)    Hypercholesteremia    Hypertension    Urine incontinence     Current Outpatient Medications:    albuterol (PROVENTIL HFA;VENTOLIN HFA) 108 (90 Base) MCG/ACT inhaler, Inhale 1-2 puffs into the lungs every 4 (four) hours as needed for wheezing or shortness of breath (or cough)., Disp: 1 Inhaler, Rfl: 0   ALPRAZolam (XANAX) 0.25 MG tablet, Take 1 tablet (0.25 mg total) by mouth daily as needed for anxiety., Disp: 30 tablet, Rfl: 0   aspirin EC 81 MG EC tablet, Take 1 tablet (81 mg total) by mouth daily., Disp: , Rfl:    blood glucose meter kit and supplies KIT, Dispense based on patient and insurance preference. Use up to four times daily as directed. Dx code: E11.9, Disp: 1 each, Rfl: 0   calcium carbonate (OS-CAL - DOSED IN MG OF ELEMENTAL CALCIUM) 1250 MG tablet, Take 2-3 tablets by mouth 2 (two) times daily. Takes 3  tablets in the morning and 2 tablets in the evening, Disp: , Rfl:    glucose blood (ONETOUCH VERIO) test strip, UAD for QID testing Dx: E11.9, Disp: 100 each, Rfl: 12   metFORMIN (GLUCOPHAGE) 1000 MG tablet, Take 1,000 mg by mouth 2 (two) times daily with a meal., Disp: , Rfl:    Multiple Vitamin (MULTIVITAMIN WITH MINERALS) TABS, Take 1 tablet by mouth daily., Disp: , Rfl:     Olmesartan-Amlodipine-HCTZ 40-5-12.5 MG TABS, TAKE 1 TABLET BY MOUTH EVERY DAY, Disp: 30 tablet, Rfl: 0   ONETOUCH DELICA LANCETS FINE MISC, UAD for qid testing Dx: E11.9, Disp: 100 each, Rfl: 12   pravastatin (PRAVACHOL) 40 MG tablet, Take 0.5 tablets (20 mg total) by mouth daily. (Patient not taking: Reported on 06/09/2017), Disp: 30 tablet, Rfl: 1   rosuvastatin (CRESTOR) 5 MG tablet, Take 1 tablet (5 mg total) by mouth daily., Disp: 30 tablet, Rfl: 11   vitamin E 400 UNIT capsule, Take 400 Units by mouth daily., Disp: , Rfl:   Social History   Tobacco Use  Smoking Status Never  Smokeless Tobacco Never    Allergies  Allergen Reactions   Sulfa Antibiotics    Penicillins Hives, Swelling and Rash   Objective:  There were no vitals filed for this visit. There is no height or weight on file to calculate BMI. Constitutional Well developed. Well nourished.  Vascular Dorsalis pedis pulses palpable bilaterally. Posterior tibial pulses palpable bilaterally. Capillary refill normal to all digits.  No cyanosis or clubbing noted. Pedal hair growth normal.  Neurologic Normal speech. Oriented to person, place, and time. Epicritic sensation to light touch grossly present bilaterally.  Dermatologic Nails well groomed and normal in appearance. No open wounds. No skin lesions.  Orthopedic: Normal joint ROM without   pain or crepitus bilaterally. No visible deformities. Tender to palpation at the calcaneal tuber left. No pain with calcaneal squeeze left. Ankle ROM diminished range of motion left. Silfverskiold Test: positive left.   Radiographs: Taken and reviewed. No acute fractures or dislocations. No evidence of stress fracture.  Plantar heel spur present. Posterior heel spur present.  Pes planovalgus foot structure  Assessment:   1. Plantar fasciitis, left    Plan:  Patient was evaluated and treated and all questions answered.  Plantar Fasciitis, left - XR reviewed as above.  -  Educated on icing and stretching. Instructions given.  - Injection delivered to the plantar fascia as below. - DME: Plantar fascial brace dispensed to support the medial longitudinal arch of the foot and offload pressure from the heel and prevent arch collapse during weightbearing - Pharmacologic management: None  Procedure: Injection Tendon/Ligament Location: Left plantar fascia at the glabrous junction; medial approach. Skin Prep: alcohol Injectate: 0.5 cc 0.5% marcaine plain, 0.5 cc of 1% Lidocaine, 0.5 cc kenalog 10. Disposition: Patient tolerated procedure well. Injection site dressed with a band-aid.  No follow-ups on file.

## 2022-04-04 ENCOUNTER — Ambulatory Visit: Payer: Medicare Other | Admitting: Podiatry

## 2022-04-04 DIAGNOSIS — M7732 Calcaneal spur, left foot: Secondary | ICD-10-CM | POA: Diagnosis not present

## 2022-04-04 DIAGNOSIS — M722 Plantar fascial fibromatosis: Secondary | ICD-10-CM

## 2022-04-09 NOTE — Progress Notes (Signed)
Subjective:  Patient ID: [name], female    DOB: 02-Apr-1941,  MRN: [id number]  Chief Complaint  Patient presents with   Plantar Fasciitis     4 wk for Plantar fasciitis left    81 y.o. female presents with the above complaint.  Patient presents with follow-up to left Planter fasciitis.  She states she is doing a lot better.  The injection definitely helped.  She is about 70% improved.  She would like to discuss next treatment plan.  Review of Systems: Negative except as noted in the HPI. Denies N/V/F/Ch.  Past Medical History:  Diagnosis Date   Allergy    Diabetes mellitus without complication (HCC)    Hypercholesteremia    Hypertension    Urine incontinence     Current Outpatient Medications:    albuterol (PROVENTIL HFA;VENTOLIN HFA) 108 (90 Base) MCG/ACT inhaler, Inhale 1-2 puffs into the lungs every 4 (four) hours as needed for wheezing or shortness of breath (or cough)., Disp: 1 Inhaler, Rfl: 0   ALPRAZolam (XANAX) 0.25 MG tablet, Take 1 tablet (0.25 mg total) by mouth daily as needed for anxiety., Disp: 30 tablet, Rfl: 0   aspirin EC 81 MG EC tablet, Take 1 tablet (81 mg total) by mouth daily., Disp: , Rfl:    blood glucose meter kit and supplies KIT, Dispense based on patient and insurance preference. Use up to four times daily as directed. Dx code: E11.9, Disp: 1 each, Rfl: 0   calcium carbonate (OS-CAL - DOSED IN MG OF ELEMENTAL CALCIUM) 1250 MG tablet, Take 2-3 tablets by mouth 2 (two) times daily. Takes 3  tablets in the morning and 2 tablets in the evening, Disp: , Rfl:    glucose blood (ONETOUCH VERIO) test strip, UAD for QID testing Dx: E11.9, Disp: 100 each, Rfl: 12   metFORMIN (GLUCOPHAGE) 1000 MG tablet, Take 1,000 mg by mouth 2 (two) times daily with a meal., Disp: , Rfl:    Multiple Vitamin (MULTIVITAMIN WITH MINERALS) TABS, Take 1 tablet by mouth daily., Disp: , Rfl:    Olmesartan-Amlodipine-HCTZ 40-5-12.5 MG TABS, TAKE 1 TABLET BY MOUTH EVERY DAY, Disp:  30 tablet, Rfl: 0   ONETOUCH DELICA LANCETS FINE MISC, UAD for qid testing Dx: E11.9, Disp: 100 each, Rfl: 12   pravastatin (PRAVACHOL) 40 MG tablet, Take 0.5 tablets (20 mg total) by mouth daily. (Patient not taking: Reported on 06/09/2017), Disp: 30 tablet, Rfl: 1   rosuvastatin (CRESTOR) 5 MG tablet, Take 1 tablet (5 mg total) by mouth daily., Disp: 30 tablet, Rfl: 11   vitamin E 400 UNIT capsule, Take 400 Units by mouth daily., Disp: , Rfl:   Social History   Tobacco Use  Smoking Status Never  Smokeless Tobacco Never    Allergies  Allergen Reactions   Sulfa Antibiotics    Penicillins Hives, Swelling and Rash   Objective:  There were no vitals filed for this visit. There is no height or weight on file to calculate BMI. Constitutional Well developed. Well nourished.  Vascular Dorsalis pedis pulses palpable bilaterally. Posterior tibial pulses palpable bilaterally. Capillary refill normal to all digits.  No cyanosis or clubbing noted. Pedal hair growth normal.  Neurologic Normal speech. Oriented to person, place, and time. Epicritic sensation to light touch grossly present bilaterally.  Dermatologic Nails well groomed and normal in appearance. No open wounds. No skin lesions.  Orthopedic: Normal joint ROM without pain or crepitus bilaterally. No visible deformities. Tender to palpation at the calcaneal tuber left. No  pain with calcaneal squeeze left. Ankle ROM diminished range of motion left. Silfverskiold Test: positive left.   Radiographs: Taken and reviewed. No acute fractures or dislocations. No evidence of stress fracture.  Plantar heel spur present. Posterior heel spur present.  Pes planovalgus foot structure  Assessment:   1. Plantar fasciitis, left   2. Heel spur, left     Plan:  Patient was evaluated and treated and all questions answered.  Plantar Fasciitis, left with underlying heel spur - XR reviewed as above.  - Educated on icing and stretching.  Instructions given.  -Second injection delivered to the plantar fascia as below. - DME: Plantar fascial brace dispensed to support the medial longitudinal arch of the foot and offload pressure from the heel and prevent arch collapse during weightbearing - Pharmacologic management: None  Procedure: Injection Tendon/Ligament Location: Left plantar fascia at the glabrous junction; medial approach. Skin Prep: alcohol Injectate: 0.5 cc 0.5% marcaine plain, 0.5 cc of 1% Lidocaine, 0.5 cc kenalog 10. Disposition: Patient tolerated procedure well. Injection site dressed with a band-aid.  No follow-ups on file.

## 2022-04-14 ENCOUNTER — Other Ambulatory Visit: Payer: Self-pay

## 2022-04-14 ENCOUNTER — Emergency Department (HOSPITAL_COMMUNITY)
Admission: EM | Admit: 2022-04-14 | Discharge: 2022-04-14 | Discharge status: Against Medical Advice | Payer: Medicare Other | Attending: Emergency Medicine | Admitting: Emergency Medicine

## 2022-04-14 ENCOUNTER — Encounter (HOSPITAL_COMMUNITY): Payer: Self-pay

## 2022-04-14 DIAGNOSIS — Z5321 Procedure and treatment not carried out due to patient leaving prior to being seen by health care provider: Secondary | ICD-10-CM | POA: Diagnosis not present

## 2022-04-14 DIAGNOSIS — H9202 Otalgia, left ear: Secondary | ICD-10-CM | POA: Diagnosis not present

## 2022-04-14 DIAGNOSIS — R42 Dizziness and giddiness: Secondary | ICD-10-CM | POA: Insufficient documentation

## 2022-04-14 LAB — CBC
HCT: 35.1 % — ABNORMAL LOW (ref 36.0–46.0)
Hemoglobin: 12.3 g/dL (ref 12.0–15.0)
MCH: 27.3 pg (ref 26.0–34.0)
MCHC: 35 g/dL (ref 30.0–36.0)
MCV: 77.8 fL — ABNORMAL LOW (ref 80.0–100.0)
Platelets: 303 10*3/uL (ref 150–400)
RBC: 4.51 MIL/uL (ref 3.87–5.11)
RDW: 14.2 % (ref 11.5–15.5)
WBC: 9.2 10*3/uL (ref 4.0–10.5)
nRBC: 0 % (ref 0.0–0.2)

## 2022-04-14 LAB — COMPREHENSIVE METABOLIC PANEL
ALT: 15 U/L (ref 0–44)
AST: 22 U/L (ref 15–41)
Albumin: 3.7 g/dL (ref 3.5–5.0)
Alkaline Phosphatase: 64 U/L (ref 38–126)
Anion gap: 8 (ref 5–15)
BUN: 14 mg/dL (ref 8–23)
CO2: 28 mmol/L (ref 22–32)
Calcium: 9.3 mg/dL (ref 8.9–10.3)
Chloride: 103 mmol/L (ref 98–111)
Creatinine, Ser: 1.07 mg/dL — ABNORMAL HIGH (ref 0.44–1.00)
GFR, Estimated: 53 mL/min — ABNORMAL LOW (ref >60)
Glucose, Bld: 120 mg/dL — ABNORMAL HIGH (ref 70–99)
Potassium: 4.2 mmol/L (ref 3.5–5.1)
Sodium: 139 mmol/L (ref 135–145)
Total Bilirubin: 0.4 mg/dL (ref 0.3–1.2)
Total Protein: 7.3 g/dL (ref 6.5–8.1)

## 2022-04-14 NOTE — ED Triage Notes (Signed)
Per EMS- Patient reports that she was sitting in a chair and turned her to the left and began having dizziness. Patient states the dizziness has not stopped. EMS states- negative stroke scale.

## 2022-04-14 NOTE — ED Provider Triage Note (Signed)
Emergency Medicine Provider Triage Evaluation Note  [name] , a 81 y.o. female  was evaluated in triage.  Pt complains of dizziness. States that she just completed abx for left sided ear infection and continues to have left sided ear pain. States that when she took her left hearing aid out she had sudden onset room spinning dizziness. States that same has improved some but is still present. Denies any history of similar. No headache  Review of Systems  Positive:  Negative:   Physical Exam  BP (!) 156/73 (BP Location: Right Arm)   Pulse 74   Temp 98.3 F (36.8 C) (Oral)   Resp 16   Ht 5\' 4"  (1.626 m)   Wt 90.7 kg   SpO2 97%   BMI 34.33 kg/m  Gen:   Awake, no distress   Resp:  Normal effort  MSK:   Moves extremities without difficulty  Other:  Alert and oriented moving all extremities normally without focal defecits.  Erythematous and injected left sided TM  Medical Decision Making  Medically screening exam initiated at 11:48 AM.  Appropriate orders placed.  [name] was informed that the remainder of the evaluation will be completed by another provider, this initial triage assessment does not replace that evaluation, and the importance of remaining in the ED until their evaluation is complete.     Penelope Galas, PA-C 04/14/22 1150

## 2022-04-28 ENCOUNTER — Encounter (HOSPITAL_BASED_OUTPATIENT_CLINIC_OR_DEPARTMENT_OTHER): Payer: Self-pay

## 2022-04-28 ENCOUNTER — Emergency Department (HOSPITAL_BASED_OUTPATIENT_CLINIC_OR_DEPARTMENT_OTHER)
Admission: EM | Admit: 2022-04-28 | Discharge: 2022-04-28 | Disposition: A | Discharge status: Home / Self Care | Payer: Medicare Other | Attending: Emergency Medicine | Admitting: Emergency Medicine

## 2022-04-28 ENCOUNTER — Emergency Department (HOSPITAL_COMMUNITY): Payer: Medicare Other

## 2022-04-28 DIAGNOSIS — I10 Essential (primary) hypertension: Secondary | ICD-10-CM | POA: Diagnosis not present

## 2022-04-28 DIAGNOSIS — H9209 Otalgia, unspecified ear: Secondary | ICD-10-CM | POA: Diagnosis not present

## 2022-04-28 DIAGNOSIS — Z79899 Other long term (current) drug therapy: Secondary | ICD-10-CM | POA: Insufficient documentation

## 2022-04-28 DIAGNOSIS — R42 Dizziness and giddiness: Secondary | ICD-10-CM | POA: Insufficient documentation

## 2022-04-28 DIAGNOSIS — Z7982 Long term (current) use of aspirin: Secondary | ICD-10-CM | POA: Diagnosis not present

## 2022-04-28 DIAGNOSIS — E119 Type 2 diabetes mellitus without complications: Secondary | ICD-10-CM | POA: Diagnosis not present

## 2022-04-28 DIAGNOSIS — Z7984 Long term (current) use of oral hypoglycemic drugs: Secondary | ICD-10-CM | POA: Diagnosis not present

## 2022-04-28 LAB — CBC WITH DIFFERENTIAL/PLATELET
Abs Immature Granulocytes: 0.03 10*3/uL (ref 0.00–0.07)
Basophils Absolute: 0.1 10*3/uL (ref 0.0–0.1)
Basophils Relative: 1 %
Eosinophils Absolute: 0.2 10*3/uL (ref 0.0–0.5)
Eosinophils Relative: 3 %
HCT: 35.7 % — ABNORMAL LOW (ref 36.0–46.0)
Hemoglobin: 12.2 g/dL (ref 12.0–15.0)
Immature Granulocytes: 0 %
Lymphocytes Relative: 27 %
Lymphs Abs: 2.5 10*3/uL (ref 0.7–4.0)
MCH: 26.2 pg (ref 26.0–34.0)
MCHC: 34.2 g/dL (ref 30.0–36.0)
MCV: 76.6 fL — ABNORMAL LOW (ref 80.0–100.0)
Monocytes Absolute: 0.7 10*3/uL (ref 0.1–1.0)
Monocytes Relative: 7 %
Neutro Abs: 5.7 10*3/uL (ref 1.7–7.7)
Neutrophils Relative %: 62 %
Platelets: 246 10*3/uL (ref 150–400)
RBC: 4.66 MIL/uL (ref 3.87–5.11)
RDW: 14.2 % (ref 11.5–15.5)
WBC: 9.2 10*3/uL (ref 4.0–10.5)
nRBC: 0 % (ref 0.0–0.2)

## 2022-04-28 LAB — BASIC METABOLIC PANEL
Anion gap: 7 (ref 5–15)
BUN: 14 mg/dL (ref 8–23)
CO2: 25 mmol/L (ref 22–32)
Calcium: 9.5 mg/dL (ref 8.9–10.3)
Chloride: 107 mmol/L (ref 98–111)
Creatinine, Ser: 1.14 mg/dL — ABNORMAL HIGH (ref 0.44–1.00)
GFR, Estimated: 49 mL/min — ABNORMAL LOW (ref >60)
Glucose, Bld: 122 mg/dL — ABNORMAL HIGH (ref 70–99)
Potassium: 3.9 mmol/L (ref 3.5–5.1)
Sodium: 139 mmol/L (ref 135–145)

## 2022-04-28 MED ORDER — LORAZEPAM 2 MG/ML IJ SOLN
1.0000 mg | Freq: Once | INTRAMUSCULAR | Status: AC
  Administered 2022-04-28: 1 mg via INTRAVENOUS
  Filled 2022-04-28: qty 1

## 2022-04-28 MED ORDER — OXYMETAZOLINE HCL 0.05 % NA SOLN
1.0000 | Freq: Once | NASAL | Status: AC
  Administered 2022-04-28: 1 via NASAL
  Filled 2022-04-28: qty 30

## 2022-04-28 MED ORDER — DIAZEPAM 5 MG PO TABS: 5.0000 mg | ORAL_TABLET | Freq: Two times a day (BID) | ORAL | 0 refills | Status: DC | PRN

## 2022-04-28 NOTE — Discharge Instructions (Addendum)
Follow-up with the ear nose and throat provider as well as neurology  I have placed a referral to the neurologist.  They should call you to schedule an appointment.  Please use caution with the medication I provided you.  May make you sleepy or woozy.  Do not drive or operate heavy machinery while taking this medication

## 2022-04-28 NOTE — ED Triage Notes (Signed)
States for the past few weeks has been having intermittent dizziness. States will get a ringing in right ear and then will feel dizzy. Being treated for URI. Been taking meclizine without relief. States has been having pain in left ear.

## 2022-04-28 NOTE — ED Provider Notes (Signed)
Patient received in transfer for MRI.  See full note for HPI  In summation 81 year old here for evaluation of dizziness since 7/17.  Seen by PCP given Antivert and Zithromax without relief.  Patient sent here for MRI to rule out posterior CVA as cause of her dizziness  Negative will need to follow-up with ENT/ Neuro on outpatient basis Physical Exam  BP (!) 159/58 (BP Location: Left Arm)   Pulse 62   Temp 97.6 F (36.4 C) (Oral)   Resp 18   Ht 5\' 4"  (1.626 m)   Wt 90.7 kg   SpO2 99%   BMI 34.33 kg/m   Physical Exam Vitals and nursing note reviewed.  Constitutional:      General: She is not in acute distress.    Appearance: She is well-developed. She is not ill-appearing.  HENT:     Head: Atraumatic.  Eyes:     Pupils: Pupils are equal, round, and reactive to light.  Cardiovascular:     Rate and Rhythm: Normal rate.  Pulmonary:     Effort: No respiratory distress.  Abdominal:     General: There is no distension.  Musculoskeletal:        General: Normal range of motion.     Cervical back: Normal range of motion.  Skin:    General: Skin is warm and dry.  Neurological:     General: No focal deficit present.     Mental Status: She is alert and oriented to person, place, and time.     Cranial Nerves: No cranial nerve deficit.     Sensory: No sensory deficit.     Motor: No weakness.     Coordination: Coordination normal.  Psychiatric:        Mood and Affect: Mood normal.     Procedures  Procedures Labs Reviewed  BASIC METABOLIC PANEL - Abnormal; Notable for the following components:      Result Value   Glucose, Bld 122 (*)    Creatinine, Ser 1.14 (*)    GFR, Estimated 49 (*)    All other components within normal limits  CBC WITH DIFFERENTIAL/PLATELET - Abnormal; Notable for the following components:   HCT 35.7 (*)    MCV 76.6 (*)    All other components within normal limits  URINALYSIS, ROUTINE W REFLEX MICROSCOPIC   No results found.   CLINICAL DATA: Acute  neurologic deficit  EXAM: MRI HEAD WITHOUT CONTRAST  TECHNIQUE: Multiplanar, multiecho pulse sequences of the brain and surrounding structures were obtained without intravenous contrast.  COMPARISON: None Available.  FINDINGS: Brain: No acute infarct, mass effect or extra-axial collection. No acute or chronic hemorrhage. Normal white matter signal. Generalized volume loss. The midline structures are normal.  Vascular: Major flow voids are preserved.  Skull and upper cervical spine: Normal calvarium and skull base. Visualized upper cervical spine and soft tissues are normal.  Sinuses/Orbits:Left mastoid effusion. Nasopharynx is clear. Paranasal sinuses are normal. Normal orbits.  IMPRESSION: 1. No acute intracranial abnormality. 2. Generalized volume loss. 3. Left mastoid effusion.   Electronically Signed By: M.D. On: 04/28/2022 20:13   ED Course / MDM    Medical Decision Making Amount and/or Complexity of Data Reviewed External Data Reviewed: labs, radiology and notes. Labs: ordered. Decision-making details documented in ED Course. Radiology: ordered and independent interpretation performed. Decision-making details documented in ED Course.  Risk OTC drugs. Prescription drug management.   Patient received in transfer for imaging  In summation 81 year old here for evaluation of  dizziness since 7/17.  Seen by PCP prescribed Antivert and Zithromax without relief.  She was sent here for MRI to rule out posterior circulation CVA as cause of her dizziness if negative plan on follow-up outpatient  Labs and imaging personally viewed and interpreted:  MR brain crossing over into epic however no acute intracranial abnormality.  Discussed results with daughter, family in room.  She does feel better after the Ativan.  She has a nonfocal neuro exam without deficits.  We will have her follow with ENT and neurology.  She will return for new or worsening symptoms  with patient and family agreeable for  The patient has been appropriately medically screened and/or stabilized in the ED. I have low suspicion for any other emergent medical condition which would require further screening, evaluation or treatment in the ED or require inpatient management.  Patient is hemodynamically stable and in no acute distress.  Patient able to ambulate in department prior to ED.  Evaluation does not show acute pathology that would require ongoing or additional emergent interventions while in the emergency department or further inpatient treatment.  I have discussed the diagnosis with the patient and answered all questions.  Pain is been managed while in the emergency department and patient has no further complaints prior to discharge.  Patient is comfortable with plan discussed in room and is stable for discharge at this time.  I have discussed strict return precautions for returning to the emergency department.  Patient was encouraged to follow-up with PCP/specialist refer to at discharge.        [name] A, PA-C 04/28/22 2209    Paula Loveless, MD 04/28/22 [phone]

## 2022-04-28 NOTE — ED Provider Notes (Signed)
[facility] Provider Note   CSN: [id number] Arrival date & time: 04/28/22  1230     History  Chief Complaint  Patient presents with   Dizziness   Otalgia    [name] is a 81 y.o. female.  Pt is a 81 yo female with a pmhx significant for DM, HTN, high cholesterol, and hearing loss.  Pt said she has had dizziness since 7/17.  She came to the ED on the 17th, but left due to the wait.  She did see her pcp who put her on antivert and zithromax.  She said that is not helping at all.  She said she had pain in her left ear, but now has pain in her right ear.  She said the dizziness is not worse with head movement.         Home Medications Prior to Admission medications   Medication Sig Start Date End Date Taking? Authorizing Provider  albuterol (PROVENTIL HFA;VENTOLIN HFA) 108 (90 Base) MCG/ACT inhaler Inhale 1-2 puffs into the lungs every 4 (four) hours as needed for wheezing or shortness of breath (or cough). 03/23/16   Schorr, Rhetta Mura, FNP  ALPRAZolam Duanne Moron) 0.25 MG tablet Take 1 tablet (0.25 mg total) by mouth daily as needed for anxiety. 05/05/16   Golden Circle, FNP  aspirin EC 81 MG EC tablet Take 1 tablet (81 mg total) by mouth daily. 08/20/12   Dixie Dials, MD  blood glucose meter kit and supplies KIT Dispense based on patient and insurance preference. Use up to four times daily as directed. Dx code: E11.9 06/09/17   Rosemarie Ax, MD  calcium carbonate (OS-CAL - DOSED IN MG OF ELEMENTAL CALCIUM) 1250 MG tablet Take 2-3 tablets by mouth 2 (two) times daily. Takes 3  tablets in the morning and 2 tablets in the evening    [provider]  glucose blood (ONETOUCH VERIO) test strip UAD for QID testing Dx: E11.9 07/06/17   Rosemarie Ax, MD  metFORMIN (GLUCOPHAGE) 1000 MG tablet Take 1,000 mg by mouth 2 (two) times daily with a meal.    [provider]  Multiple Vitamin (MULTIVITAMIN WITH MINERALS) TABS Take 1  tablet by mouth daily.    [provider]  Olmesartan-Amlodipine-HCTZ 40-5-12.5 MG TABS TAKE 1 TABLET BY MOUTH EVERY DAY 11/04/16   Golden Circle, FNP  [employer] LANCETS FINE MISC UAD for qid testing Dx: E11.9 07/06/17   Rosemarie Ax, MD  pravastatin (PRAVACHOL) 40 MG tablet Take 0.5 tablets (20 mg total) by mouth daily. Patient not taking: Reported on 06/09/2017 05/07/17   Golden Circle, FNP  rosuvastatin (CRESTOR) 5 MG tablet Take 1 tablet (5 mg total) by mouth daily. 06/26/17 09/24/17  Imogene Burn, PA-C  vitamin E 400 UNIT capsule Take 400 Units by mouth daily.    [provider]      Allergies    Sulfa antibiotics and Penicillins    Review of Systems   Review of Systems  HENT:         Right ear pain  Neurological:  Positive for dizziness.  All other systems reviewed and are negative.   Physical Exam Updated Vital Signs BP (!) 156/63 (BP Location: Right Arm)   Pulse 94   Temp 98.5 F (36.9 C) (Oral)   Resp 20   Ht $R'5\' 4"'PM$  (1.626 m)   Wt 90.7 kg   SpO2 99%   BMI 34.33 kg/m  Physical  Exam Vitals and nursing note reviewed.  Constitutional:      Appearance: Normal appearance.  HENT:     Head: Normocephalic and atraumatic.     Comments: Small amt of clear fluid behind right TM    Right Ear: External ear normal.     Left Ear: External ear normal.     Nose: Nose normal.     Mouth/Throat:     Mouth: Mucous membranes are moist.     Pharynx: Oropharynx is clear.  Eyes:     Extraocular Movements: Extraocular movements intact.     Conjunctiva/sclera: Conjunctivae normal.     Pupils: Pupils are equal, round, and reactive to light.  Cardiovascular:     Rate and Rhythm: Normal rate.  Pulmonary:     Effort: Pulmonary effort is normal.     Breath sounds: Normal breath sounds.  Abdominal:     General: Abdomen is flat. Bowel sounds are normal.     Palpations: Abdomen is soft.  Musculoskeletal:        General: Normal range of motion.      Cervical back: Normal range of motion and neck supple.  Skin:    General: Skin is warm.     Capillary Refill: Capillary refill takes less than 2 seconds.  Neurological:     General: No focal deficit present.     Mental Status: She is alert and oriented to person, place, and time.  Psychiatric:        Mood and Affect: Mood normal.        Behavior: Behavior normal.     ED Results / Procedures / Treatments   Labs (all labs ordered are listed, but only abnormal results are displayed) Labs Reviewed  CBC WITH DIFFERENTIAL/PLATELET - Abnormal; Notable for the following components:      Result Value   HCT 35.7 (*)    MCV 76.6 (*)    All other components within normal limits  BASIC METABOLIC PANEL  URINALYSIS, ROUTINE W REFLEX MICROSCOPIC    EKG None  Radiology No results found.  Procedures Procedures    Medications Ordered in ED Medications  oxymetazoline (AFRIN) 0.05 % nasal spray 1 spray (has no administration in time range)  LORazepam (ATIVAN) injection 1 mg (1 mg Intravenous Given 04/28/22 1608)    ED Course/ Medical Decision Making/ A&P                           Medical Decision Making Amount and/or Complexity of Data Reviewed Labs: ordered. Radiology: ordered.  Risk Prescription drug management.   This patient presents to the ED for concern of dizziness, this involves an extensive number of treatment options, and is a complaint that carries with it a high risk of complications and morbidity.  The differential diagnosis includes vertigo, cva   Co morbidities that complicate the patient evaluation  DM, HTN, high cholesterol, and hearing loss   Additional history obtained:  Additional history obtained from epic chart review External records from outside source obtained and reviewed including family   Lab Tests:  I Ordered, and personally interpreted labs.  The pertinent results include:  cbc is nl   Imaging Studies ordered:  I ordered imaging  studies including MRI.  We don't have a MRI here, so I have ordered it to be done at [facility].   Cardiac Monitoring:  The patient was maintained on a cardiac monitor.  I personally viewed and interpreted the cardiac monitored which  showed an underlying rhythm of: nsr   Medicines ordered and prescription drug management:  I ordered medication including ativan and afrin  for dizziness  Reevaluation of the patient after these medicines showed that the patient improved I have reviewed the patients home medicines and have made adjustments as needed   Test Considered:  MRI   Critical Interventions:  MRI   Problem List / ED Course:  Dizziness:  Sx have been going on for several days, so she's out of a stroke window.  However, sx are not responding to usual vertigo tx.  She has multiple risk factors for stroke, so I think she needs a MRI.  We don't have one here, so pt is willing to go to [facility] to get this done.  I spoke with Dr. Regenia Skeeter who has accepted her for transfer.  If it is normal, she can go home with ENT follow up.   Reevaluation:  After the interventions noted above, I reevaluated the patient and found that they have :stayed the same   Social Determinants of Health:  Lives at home   Dispostion:  After consideration of the diagnostic results and the patients response to treatment, I feel that the patent would benefit from transfer to [facility].          Final Clinical Impression(s) / ED Diagnoses Final diagnoses:  Dizziness    Rx / DC Orders ED Discharge Orders     None         Isla Pence, MD 04/28/22 [phone]

## 2022-04-28 NOTE — ED Notes (Signed)
Spoke to [facility] ED staff , made aware about patient's transfer for MRI testing .

## 2022-05-07 ENCOUNTER — Ambulatory Visit (INDEPENDENT_AMBULATORY_CARE_PROVIDER_SITE_OTHER): Payer: Medicare Other | Admitting: Neurology

## 2022-05-07 ENCOUNTER — Encounter: Payer: Self-pay | Admitting: Neurology

## 2022-05-07 VITALS — BP 153/80 | HR 97 | Ht 64.0 in | Wt 205.0 lb

## 2022-05-07 DIAGNOSIS — R2689 Other abnormalities of gait and mobility: Secondary | ICD-10-CM | POA: Diagnosis not present

## 2022-05-07 DIAGNOSIS — H8302 Labyrinthitis, left ear: Secondary | ICD-10-CM

## 2022-05-07 NOTE — Progress Notes (Signed)
[facility]  PATIENT: [name] DOB: 07-07-1941  REQUESTING CLINICIAN: Henderly, Britni A, PA-C HISTORY FROM: Patient  REASON FOR VISIT: Dizziness    HISTORICAL  CHIEF COMPLAINT:  Chief Complaint  Patient presents with   New Patient (Initial Visit)    Room 12 w/ daughter, Sheran Luz. Hospital follow from 04/28/22 for dizziness. Since her discharge, reports only having one significant dizzy spell. She took Dramamine and laying down for a nap. 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 She has been seen by ENT. Treated for a sinus infection but symptoms have not changed.     HISTORY OF PRESENT ILLNESS:  This is a 81 year old woman past medical history of hypertension, hyperlipidemia, diabetes mellitus who is presenting with ongoing dizziness for the past month.  She reports initially battling upper respiratory infection, seen his primary care doctor and was prescribed Zithromax.  On July 17 she presented to the ED due to ongoing dizziness but was not seen due to long wait time.  She went back to see her PMD again who gave her meclizine and another round of Zithromax without clear benefit.  On July 31 she presented again.  MRI negative for any acute stroke but showed left mastoid effusion.  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  She has not seen ENT yet.  She does complain of ongoing tinnitus, and occasional dizziness that she describes as balance problems.  Denies any room spinning sensation.  Denies any fall    OTHER MEDICAL CONDITIONS: Hypertension, hyperlipidemia, DM   REVIEW OF SYSTEMS: Full 14 system review of systems performed and negative with exception of: as noted in the HPI   ALLERGIES: Allergies  Allergen Reactions   Sulfa Antibiotics    Penicillins Hives, Swelling and Rash    HOME MEDICATIONS: Outpatient Medications Prior to Visit  Medication Sig Dispense  Refill   albuterol (PROVENTIL HFA;VENTOLIN HFA) 108 (90 Base) MCG/ACT inhaler Inhale 1-2 puffs into the lungs every 4 (four) hours as needed for wheezing or shortness of breath (or cough). 1 Inhaler 0   ALPRAZolam (XANAX) 0.25 MG tablet Take 1 tablet (0.25 mg total) by mouth daily as needed for anxiety. 30 tablet 0   aspirin EC 81 MG EC tablet Take 1 tablet (81 mg total) by mouth daily.     blood glucose meter kit and supplies KIT Dispense based on patient and insurance preference. Use up to four times daily as directed. Dx code: E11.9 1 each 0   calcium carbonate (OS-CAL - DOSED IN MG OF ELEMENTAL CALCIUM) 1250 MG tablet Take 2-3 tablets by mouth 2 (two) times daily. Takes 3  tablets in the morning and 2 tablets in the evening     diazepam (VALIUM) 5 MG tablet Take 1 tablet (5 mg total) by mouth every 12 (twelve) hours as needed (dizziness). 10 tablet 0   glucose blood (ONETOUCH VERIO) test strip UAD for QID testing Dx: E11.9 100 each 12   metFORMIN (GLUCOPHAGE) 1000 MG tablet Take 1,000 mg by mouth 2 (two) times daily with a meal.     Multiple Vitamin (MULTIVITAMIN WITH MINERALS) TABS Take 1 tablet by mouth daily.     Olmesartan-Amlodipine-HCTZ 40-5-12.5 MG TABS TAKE 1 TABLET BY MOUTH EVERY DAY 30 tablet 0   ONETOUCH DELICA LANCETS FINE MISC UAD for qid testing Dx: E11.9 100 each 12   pravastatin (PRAVACHOL) 40 MG tablet Take 0.5  tablets (20 mg total) by mouth daily. 30 tablet 1   vitamin E 400 UNIT capsule Take 400 Units by mouth daily.     rosuvastatin (CRESTOR) 5 MG tablet Take 1 tablet (5 mg total) by mouth daily. 30 tablet 11   No facility-administered medications prior to visit.    PAST MEDICAL HISTORY: Past Medical History:  Diagnosis Date   Allergy    Diabetes mellitus without complication ([place])    Hypercholesteremia    Hypertension    Urine incontinence     PAST SURGICAL HISTORY: Past Surgical History:  Procedure Laterality Date   CHOLECYSTECTOMY     TUBAL LIGATION       FAMILY HISTORY: Family History  Problem Relation Age of Onset   Arthritis Mother    Hyperlipidemia Mother    Heart disease Mother    Hypertension Mother    Diabetes Mother    Healthy Father    Diabetes Other    Hypertension Other     SOCIAL HISTORY: Social History   Socioeconomic History   Marital status: Widowed    Spouse name: Not on file   Number of children: 6   Years of education: 39   Highest education level: Not on file  Occupational History   Occupation: Retired  Tobacco Use   Smoking status: Never   Smokeless tobacco: Never  Vaping Use   Vaping Use: Never used  Substance and Sexual Activity   Alcohol use: No   Drug use: No   Sexual activity: Not on file  Other Topics Concern   Not on file  Social History Narrative   Denies abuse and feels safe at home.   Denies religious beliefs effecting health care.    Right-handed.   Lives with her daughter.    Caffeine: 2 cups per day.   Social Determinants of Health   Financial Resource Strain: Not on file  Food Insecurity: Not on file  Transportation Needs: Not on file  Physical Activity: Not on file  Stress: Not on file  Social Connections: Not on file  Intimate Partner Violence: Not on file    PHYSICAL EXAM  GENERAL EXAM/CONSTITUTIONAL: Vitals:  Vitals:   05/07/22 0829  BP: (!) 153/80  Pulse: 97  Weight: 205 lb (93 kg)  Height: 5' 4" (1.626 m)   Body mass index is 35.19 kg/m. Wt Readings from Last 3 Encounters:  05/07/22 205 lb (93 kg)  04/28/22 200 lb (90.7 kg)  04/14/22 200 lb (90.7 kg)   Patient is in no distress; well developed, nourished and groomed; neck is supple   EYES: Pupils round and reactive to light, Visual fields full to confrontation, Extraocular movements intacts,   MUSCULOSKELETAL: Gait, strength, tone, movements noted in Neurologic exam below  NEUROLOGIC: MENTAL STATUS:      No data to display         awake, alert, oriented to person, place and  time recent and remote memory intact normal attention and concentration language fluent, comprehension intact, naming intact fund of knowledge appropriate  CRANIAL NERVE:  2nd, 3rd, 4th, 6th - pupils equal and reactive to light, visual fields full to confrontation, extraocular muscles intact, no nystagmus 5th - facial sensation symmetric 7th - facial strength symmetric 8th - hearing intact 9th - palate elevates symmetrically, uvula midline 11th - shoulder shrug symmetric 12th - tongue protrusion midline  MOTOR:  normal bulk and tone, full strength in the BUE, BLE  COORDINATION:  finger-nose-finger, fine finger movements normal  GAIT/STATION:  normal    DIAGNOSTIC DATA (LABS, IMAGING, TESTING) - I reviewed patient records, labs, notes, testing and imaging myself where available.  Lab Results  Component Value Date   WBC 9.2 04/28/2022   HGB 12.2 04/28/2022   HCT 35.7 (L) 04/28/2022   MCV 76.6 (L) 04/28/2022   PLT 246 04/28/2022      Component Value Date/Time   NA 139 04/28/2022 1607   K 3.9 04/28/2022 1607   CL 107 04/28/2022 1607   CO2 25 04/28/2022 1607   GLUCOSE 122 (H) 04/28/2022 1607   BUN 14 04/28/2022 1607   CREATININE 1.14 (H) 04/28/2022 1607   CALCIUM 9.5 04/28/2022 1607   PROT 7.3 04/14/2022 1211   ALBUMIN 3.7 04/14/2022 1211   AST 22 04/14/2022 1211   ALT 15 04/14/2022 1211   ALKPHOS 64 04/14/2022 1211   BILITOT 0.4 04/14/2022 1211   GFRNONAA 49 (L) 04/28/2022 1607   GFRAA >60 04/04/2011 0604   Lab Results  Component Value Date   CHOL 296 (H) 05/07/2017   HDL 48.50 05/07/2017   LDLCALC 229 (H) 05/07/2017   TRIG 94.0 05/07/2017   CHOLHDL 6 05/07/2017   Lab Results  Component Value Date   HGBA1C 6.0 05/07/2017   No results found for: "VITAMINB12" Lab Results  Component Value Date   TSH 2.337 04/01/2011    MRI Brain 04/28/2022 1. No acute intracranial abnormality. 2. Generalized volume loss. 3. Left mastoid effusion.    ASSESSMENT  AND PLAN  81 y.o. year old female with hypertension, hyperlipidemia, diabetes mellitus who is presenting with ongoing balance problems and dizziness for the past month found to have a left mastoid effusion.  Patient also complained of upper respiratory infection prior to start of the dizziness.  Patient likely have labyrinthitis (due to mastoid effusion) causing her symptoms.  She has completed 2 rounds of azithromycin.  At this time, we will continue to observe her and I informed her that her symptoms will most likely improve.  She does have an appointment with ENT pending in a couple months.  Continue to follow with PCP and return as needed or any other concerns.   1. Labyrinthitis of left ear   2. Balance problem   3.      Mastoid effusion    Patient Instructions  Continue current medications Follow up with PCP  Follow up with ENT    No orders of the defined types were placed in this encounter.   No orders of the defined types were placed in this encounter.   Return if symptoms worsen or fail to improve.  I have spent a total of 50 minutes dedicated to this patient today, preparing to see patient, performing a medically appropriate examination and evaluation, ordering tests and/or medications and procedures, and counseling and educating the patient/family/caregiver; independently interpreting result and communicating results to the family/patient/caregiver; and documenting clinical information in the electronic medical record.   Alric Ran, MD 05/07/2022, 9:03 AM  [facility] [address], [place], [place] [address] [phone]

## 2022-05-07 NOTE — Patient Instructions (Signed)
Continue current medications Follow up with PCP  Follow up with ENT

## 2022-06-25 ENCOUNTER — Ambulatory Visit: Payer: Medicare Other | Admitting: Podiatry

## 2023-02-15 ENCOUNTER — Encounter (HOSPITAL_BASED_OUTPATIENT_CLINIC_OR_DEPARTMENT_OTHER): Payer: Self-pay

## 2023-02-15 ENCOUNTER — Other Ambulatory Visit: Payer: Self-pay

## 2023-02-15 ENCOUNTER — Emergency Department (HOSPITAL_BASED_OUTPATIENT_CLINIC_OR_DEPARTMENT_OTHER)
Admission: EM | Admit: 2023-02-15 | Discharge: 2023-02-15 | Disposition: A | Discharge status: Home / Self Care | Payer: Medicare PPO | Attending: Emergency Medicine | Admitting: Emergency Medicine

## 2023-02-15 DIAGNOSIS — E119 Type 2 diabetes mellitus without complications: Secondary | ICD-10-CM | POA: Diagnosis not present

## 2023-02-15 DIAGNOSIS — R42 Dizziness and giddiness: Secondary | ICD-10-CM | POA: Diagnosis present

## 2023-02-15 DIAGNOSIS — I10 Essential (primary) hypertension: Secondary | ICD-10-CM | POA: Diagnosis not present

## 2023-02-15 DIAGNOSIS — Z7982 Long term (current) use of aspirin: Secondary | ICD-10-CM | POA: Diagnosis not present

## 2023-02-15 LAB — CBG MONITORING, ED: Glucose-Capillary: 171 mg/dL — ABNORMAL HIGH (ref 70–99)

## 2023-02-15 NOTE — Discharge Instructions (Signed)
Please read and follow all provided instructions.  Your diagnoses today include:  1. Hypertension, unspecified type   2. Dizziness     Your blood pressure was high today (BP): BP (!) 162/68   Pulse 77   Temp 97.9 F (36.6 C) (Oral)   Resp 15   Ht 5\' 4"  (1.626 m)   Wt 90.7 kg   SpO2 100%   BMI 34.33 kg/m   Tests performed today include: Vital signs. See below for your results today.   Medications prescribed:  None  Home care instructions:  Follow any educational materials contained in this packet.  Follow-up instructions: Please follow-up with your primary care provider in the next 7 days for a recheck of your symptoms and blood pressure if not improving.   Return instructions:  Please return to the Emergency Department if you experience worsening symptoms.  Return with severe chest pain, abdominal pain, or shortness of breath.  Return with severe headache, focal weakness, numbness, difficulty with speech or vision. Return with loss of vision or sudden vision change. Please return if you have any other emergent concerns.  Additional Information:  Your vital signs today were: BP (!) 162/68   Pulse 77   Temp 97.9 F (36.6 C) (Oral)   Resp 15   Ht 5\' 4"  (1.626 m)   Wt 90.7 kg   SpO2 100%   BMI 34.33 kg/m  If your blood pressure (BP) was elevated above 135/85 this visit, please have this repeated by your doctor within one month. --------------

## 2023-02-15 NOTE — ED Provider Notes (Signed)
[facility] Provider Note   CSN: [id number] Arrival date & time: 02/15/23  1010     History  Chief Complaint  Patient presents with   Hypertension    [name] is a 82 y.o. female.  Patient with history of hypertension, diabetes -- presents to the emergency department today for evaluation of headache and dizziness in setting of high blood pressure.  Patient is on a combination blood pressure medication.  Usually she has near normal blood pressure readings with her medication.  She admits to forgetting to take her blood pressure medication over the past 2 days.  Last dose was this morning.  She has felt dizzy, described as off-balance. Patient denies signs of stroke including: facial droop, slurred speech, aphasia, weakness/numbness in extremities, imbalance/trouble walking.  She has not been lightheaded or had any near syncopal spells.  She has had a "pinch" of a headache, but not severe.  No neck pain.  Otherwise denies chest pain, shortness of breath, lower extremity swelling.       Home Medications Prior to Admission medications   Medication Sig Start Date End Date Taking? Authorizing Provider  albuterol (PROVENTIL HFA;VENTOLIN HFA) 108 (90 Base) MCG/ACT inhaler Inhale 1-2 puffs into the lungs every 4 (four) hours as needed for wheezing or shortness of breath (or cough). 03/23/16   Schorr, Roma Kayser, FNP  ALPRAZolam Prudy Feeler) 0.25 MG tablet Take 1 tablet (0.25 mg total) by mouth daily as needed for anxiety. 05/05/16   Veryl Speak, FNP  aspirin EC 81 MG EC tablet Take 1 tablet (81 mg total) by mouth daily. 08/20/12   Orpah Cobb, MD  blood glucose meter kit and supplies KIT Dispense based on patient and insurance preference. Use up to four times daily as directed. Dx code: E11.9 06/09/17   Myra Rude, MD  calcium carbonate (OS-CAL - DOSED IN MG OF ELEMENTAL CALCIUM) 1250 MG tablet Take 2-3 tablets by mouth 2 (two) times  daily. Takes 3  tablets in the morning and 2 tablets in the evening    [provider]  diazepam (VALIUM) 5 MG tablet Take 1 tablet (5 mg total) by mouth every 12 (twelve) hours as needed (dizziness). 04/28/22   Henderly, Britni A, PA-C  glucose blood (ONETOUCH VERIO) test strip UAD for QID testing Dx: E11.9 07/06/17   Myra Rude, MD  metFORMIN (GLUCOPHAGE) 1000 MG tablet Take 1,000 mg by mouth 2 (two) times daily with a meal.    [provider]  Multiple Vitamin (MULTIVITAMIN WITH MINERALS) TABS Take 1 tablet by mouth daily.    [provider]  Olmesartan-Amlodipine-HCTZ 40-5-12.5 MG TABS TAKE 1 TABLET BY MOUTH EVERY DAY 11/04/16   Veryl Speak, FNP  [facility] LANCETS FINE MISC UAD for qid testing Dx: E11.9 07/06/17   Myra Rude, MD  pravastatin (PRAVACHOL) 40 MG tablet Take 0.5 tablets (20 mg total) by mouth daily. 05/07/17   Veryl Speak, FNP  rosuvastatin (CRESTOR) 5 MG tablet Take 1 tablet (5 mg total) by mouth daily. 06/26/17 09/24/17  Dyann Kief, PA-C  vitamin E 400 UNIT capsule Take 400 Units by mouth daily.    [provider]      Allergies    Sulfa antibiotics and Penicillins    Review of Systems   Review of Systems  Physical Exam Updated Vital Signs BP (!) 162/68   Pulse 77   Temp 97.9 F (36.6 C) (Oral)  Resp 15   Ht 5\' 4"  (1.626 m)   Wt 90.7 kg   SpO2 100%   BMI 34.33 kg/m   Physical Exam Vitals and nursing note reviewed.  Constitutional:      General: She is not in acute distress.    Appearance: She is well-developed.  HENT:     Head: Normocephalic and atraumatic.     Right Ear: External ear normal.     Left Ear: External ear normal.     Nose: Nose normal.  Eyes:     Conjunctiva/sclera: Conjunctivae normal.  Cardiovascular:     Rate and Rhythm: Normal rate and regular rhythm.     Heart sounds: No murmur heard. Pulmonary:     Effort: No respiratory distress.     Breath sounds: No wheezing,  rhonchi or rales.  Abdominal:     Palpations: Abdomen is soft.     Tenderness: There is no abdominal tenderness. There is no guarding or rebound.  Musculoskeletal:     Cervical back: Normal range of motion and neck supple.     Right lower leg: No edema.     Left lower leg: No edema.  Skin:    General: Skin is warm and dry.     Findings: No rash.  Neurological:     General: No focal deficit present.     Mental Status: She is alert. Mental status is at baseline.     Cranial Nerves: No cranial nerve deficit.     Sensory: No sensory deficit.     Motor: No weakness.  Psychiatric:        Mood and Affect: Mood normal.     ED Results / Procedures / Treatments   Labs (all labs ordered are listed, but only abnormal results are displayed) Labs Reviewed - No data to display  ED ECG REPORT   Date: 02/15/2023  Rate: 71  Rhythm: normal sinus rhythm  QRS Axis: normal  Intervals: normal  ST/T Wave abnormalities: normal  Conduction Disutrbances:none  Narrative Interpretation:   Old EKG Reviewed: unchanged from 7/23  I have personally reviewed the EKG tracing and agree with the computerized printout as noted.   Radiology No results found.  Procedures Procedures    Medications Ordered in ED Medications - No data to display  ED Course/ Medical Decision Making/ A&P    Patient seen and examined. History obtained directly from patient.  Patient looks well.  Labs/EKG: Ordered EKG, CBG.  Imaging: None ordered  Medications/Fluids: None ordered  Most recent vital signs reviewed and are as follows: BP (!) 162/68   Pulse 77   Temp 97.9 F (36.6 C) (Oral)   Resp 15   Ht 5\' 4"  (1.626 m)   Wt 90.7 kg   SpO2 100%   BMI 34.33 kg/m   Initial impression: Hypertension with mild headache and dizziness.  No strokelike symptoms.  Patient looks well, near baseline.  Do not feel labs other than CBG indicated at this point.  Will recheck blood pressure.  Patient and family member  counseled on close monitoring of blood pressure over the next couple of days.  11:46 AM Reassessment performed. Patient appears stable.  Blood pressure continues to improve.    Labs personally reviewed and interpreted including: EKG reviewed as above.  Blood sugar in 170s.  Reviewed pertinent lab work and imaging with patient at bedside. Questions answered.   Most current vital signs reviewed and are as follows: BP (!) 162/68   Pulse 77  Temp 97.9 F (36.6 C) (Oral)   Resp 15   Ht 5\' 4"  (1.626 m)   Wt 90.7 kg   SpO2 100%   BMI 34.33 kg/m   Plan: Discharge to home.   Prescriptions written for: None  Other home care instructions discussed: Monitoring of blood pressure, stressed importance of compliance with medication  ED return instructions discussed: Patient counseled to return if they have weakness in their arms or legs, slurred speech, trouble walking or talking, confusion, trouble with their balance, or if they have any other concerns. Patient verbalizes understanding and agrees with plan.   Follow-up instructions discussed: Patient encouraged to follow-up with their PCP in next week with persistently elevated blood pressure or persistent symptoms.                            Medical Decision Making  Patient presents to the emergency department today for valuation of high blood pressure.  This is likely because she missed 2 days of blood pressure medication, resumed this morning.  She was worried that her blood pressure was so high.  This has continued to improve during ED stay.  She does not have significant signs suggestive of endorgan damage.  She has had some dizziness that is vague.  No persistent vertigo and at this time I have low concern for a stroke.  She has had a mild headache, no severe headache, no thunderclap.  Reassuring neuro exam.  No other endorgan damage suspected.  Patient stable and comfortable with discharge.    The patient's vital signs, pertinent lab work  and imaging were reviewed and interpreted as discussed in the ED course. Hospitalization was considered for further testing, treatments, or serial exams/observation. However as patient is well-appearing, has a stable exam, and reassuring studies today, I do not feel that they warrant admission at this time. This plan was discussed with the patient who verbalizes agreement and comfort with this plan and seems reliable and able to return to the Emergency Department with worsening or changing symptoms.            Final Clinical Impression(s) / ED Diagnoses Final diagnoses:  Hypertension, unspecified type  Dizziness    Rx / DC Orders ED Discharge Orders     None         Renne Crigler, PA-C 02/15/23 1149    Tegeler, Canary Brim, MD 02/15/23 1343

## 2023-02-15 NOTE — ED Triage Notes (Signed)
Pt reports blood pressure was high (220/100) and felt a little dizzy. Denies dizziness at present. Pt forgot to take home BP medications for a few days. Took dose of home BP med this am.

## 2023-05-21 ENCOUNTER — Ambulatory Visit: Payer: BC Managed Care – PPO | Admitting: Cardiology

## 2023-05-21 ENCOUNTER — Encounter: Payer: Self-pay | Admitting: Cardiology

## 2023-05-21 VITALS — BP 125/67 | HR 64 | Resp 16 | Ht 64.0 in | Wt 198.0 lb

## 2023-05-21 DIAGNOSIS — R0609 Other forms of dyspnea: Secondary | ICD-10-CM

## 2023-05-21 DIAGNOSIS — E7800 Pure hypercholesterolemia, unspecified: Secondary | ICD-10-CM

## 2023-05-21 DIAGNOSIS — E1165 Type 2 diabetes mellitus with hyperglycemia: Secondary | ICD-10-CM

## 2023-05-21 DIAGNOSIS — I10 Essential (primary) hypertension: Secondary | ICD-10-CM

## 2023-05-21 NOTE — Progress Notes (Signed)
ID:  Paula, Smith [birth year], MRN [id number]  PCP:  Adrian Prince, MD  Cardiologist:  Tessa Lerner, DO, [facility] (established care 05/21/23) Former Cardiology Providers: Dr. Sharyn Lull, Ms Jacolyn Reedy   REASON FOR CONSULT: Dyspnea on exertion  REQUESTING PHYSICIAN:  Adrian Prince, MD [address] [place],  [place] [address]  Chief Complaint  Patient presents with   New Patient (Initial Visit)        Shortness of Breath    HPI  [name] is a 82 y.o. African-American female who presents to the clinic for evaluation of dyspnea on exertion at the request of Adrian Prince, MD. Her past medical history and cardiovascular risk factors include: Hypertension, diabetes, hypercholesterolemia, anxiety.   Shortness of breath: Ongoing for the last 3 to 4 months. Predominantly with effort related activities but at times can be random. Intermittent. Lasting for few minutes. Self-limited. No associated precordial chest pain. Her overall symptoms are not progressive Blood pressures on current medical therapy range between 120-130 mmHg.  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  Echocardiogram: No results found for this or any previous visit from the past 1095 days.   Stress Testing: 05/2017  Nuclear stress EF: 82%.  Blood pressure demonstrated a normal response to exercise.  There was no ST segment deviation noted during stress.  The study is normal.  This is a low risk study.  The left ventricular ejection fraction is normal (55-65%).   ALLERGIES: Allergies  Allergen Reactions   Sulfa Antibiotics    Penicillins Hives, Swelling and Rash    MEDICATION LIST PRIOR TO VISIT: Current Meds  Medication Sig   ALPRAZolam (XANAX) 0.25 MG tablet Take 1 tablet (0.25 mg total) by mouth daily as needed for anxiety.    blood glucose meter kit and supplies KIT Dispense based on patient and insurance preference. Use up to four times daily as directed. Dx code: E11.9   ezetimibe (ZETIA) 10 MG tablet Take 10 mg by mouth daily.   glucose blood (ONETOUCH VERIO) test strip UAD for QID testing Dx: E11.9   metFORMIN (GLUCOPHAGE) 1000 MG tablet Take 1,000 mg by mouth 2 (two) times daily with a meal.   Multiple Vitamin (MULTIVITAMIN WITH MINERALS) TABS Take 1 tablet by mouth daily.   Olmesartan-amLODIPine-HCTZ 40-10-25 MG TABS Take 1 tablet by mouth daily.   ONETOUCH DELICA LANCETS FINE MISC UAD for qid testing Dx: E11.9   pravastatin (PRAVACHOL) 40 MG tablet Take 0.5 tablets (20 mg total) by mouth daily.   vitamin E 400 UNIT capsule Take 400 Units by mouth daily.     PAST MEDICAL HISTORY: Past Medical History:  Diagnosis Date   Allergy    Diabetes mellitus without complication (HCC)    Hypercholesteremia    Hypertension    Urine incontinence     PAST SURGICAL HISTORY: Past Surgical History:  Procedure Laterality Date   CHOLECYSTECTOMY     TUBAL LIGATION      FAMILY HISTORY: The patient family history includes Arthritis in her mother; Diabetes in her mother and another family member; Healthy in her father; Heart disease in her mother; Hyperlipidemia in her mother; Hypertension in her mother and another family member.  SOCIAL HISTORY:  The patient  reports that she has quit smoking. Her smoking use included cigarettes. She started smoking about 33 years ago. She has a 16.8 pack-year smoking history. She has  never used smokeless tobacco. She reports that she does not currently use alcohol. She reports that she does not use drugs.  REVIEW OF SYSTEMS: Review of Systems  Cardiovascular:  Positive for dyspnea on exertion. Negative for chest pain, claudication, irregular heartbeat, leg swelling, near-syncope, orthopnea, palpitations, paroxysmal nocturnal dyspnea and syncope.  Respiratory:  Positive for shortness  of breath.   Hematologic/Lymphatic: Negative for bleeding problem.  Musculoskeletal:  Positive for joint pain. Negative for muscle cramps and myalgias.  Neurological:  Negative for dizziness and light-headedness.    PHYSICAL EXAM:    05/21/2023    2:11 PM 02/15/2023   11:55 AM 02/15/2023   10:45 AM  Vitals with BMI  Height 5\' 4"     Weight 198 lbs    BMI 33.97    Systolic 125 162 829  Diastolic 67 71 68  Pulse 64 77     Physical Exam  Constitutional: No distress.  Age appropriate, hemodynamically stable.   Neck: No JVD present.  Cardiovascular: Normal rate, regular rhythm, S1 normal, S2 normal, intact distal pulses and normal pulses. Exam reveals no gallop, no S3 and no S4.  No murmur heard. Pulmonary/Chest: Effort normal and breath sounds normal. No stridor. She has no wheezes. She has no rales.  Abdominal: Soft. Bowel sounds are normal. She exhibits no distension. There is no abdominal tenderness.  Musculoskeletal:        General: No edema.     Cervical back: Neck supple.  Neurological: She is alert and oriented to person, place, and time. She has intact cranial nerves (2-12).  Skin: Skin is warm and moist.   LABORATORY DATA: External Labs: Collected: March 26, 2022 provided by PCP Sodium 140, potassium 4, chloride 109, bicarb 22 Creatinine 1.1. BUN 17  Collected: March 17, 2023 provided by PCP. Creatinine 1.1.  BUN 20 eGFR 58. Sodium 138, potassium 3.9, chloride 105, bicarb 24 Total cholesterol 235, triglycerides 90, HDL 42, LDL calculated 175, non-HDL 193      Latest Ref Rng & Units 04/28/2022    4:07 PM 04/14/2022   12:11 PM 05/07/2017   10:10 AM  CBC  WBC 4.0 - 10.5 K/uL 9.2  9.2  7.1   Hemoglobin 12.0 - 15.0 g/dL 56.2  13.0  86.5   Hematocrit 36.0 - 46.0 % 35.7  35.1  38.1   Platelets 150 - 400 K/uL 246  303  265.0        Latest Ref Rng & Units 04/28/2022    4:07 PM 04/14/2022   12:11 PM 05/07/2017   10:10 AM  CMP  Glucose 70 - 99 mg/dL 784  696  295   BUN  8 - 23 mg/dL 14  14  16    Creatinine 0.44 - 1.00 mg/dL 2.84  1.32  4.40   Sodium 135 - 145 mmol/L 139  139  140   Potassium 3.5 - 5.1 mmol/L 3.9  4.2  4.0   Chloride 98 - 111 mmol/L 107  103  104   CO2 22 - 32 mmol/L 25  28  29    Calcium 8.9 - 10.3 mg/dL 9.5  9.3  9.8   Total Protein 6.5 - 8.1 g/dL  7.3  7.1   Total Bilirubin 0.3 - 1.2 mg/dL  0.4  0.5   Alkaline Phos 38 - 126 U/L  64  43   AST 15 - 41 U/L  22  15   ALT 0 - 44 U/L  15  11  Lab Results  Component Value Date   CHOL 296 (H) 05/07/2017   HDL 48.50 05/07/2017   LDLCALC 229 (H) 05/07/2017   TRIG 94.0 05/07/2017   CHOLHDL 6 05/07/2017   No components found for: "NTPROBNP" No results for input(s): "PROBNP" in the last 8760 hours. No results for input(s): "TSH" in the last 8760 hours.  BMP No results for input(s): "NA", "K", "CL", "CO2", "GLUCOSE", "BUN", "CREATININE", "CALCIUM", "GFRNONAA", "GFRAA" in the last 8760 hours.  HEMOGLOBIN A1C Lab Results  Component Value Date   HGBA1C 6.0 05/07/2017   MPG 146 (H) 04/01/2011    IMPRESSION:    ICD-10-CM   1. DOE (dyspnea on exertion)  R06.09 EKG 12-Lead    PCV ECHOCARDIOGRAM COMPLETE    CT CARDIAC SCORING (SELF PAY ONLY)    2. Essential hypertension  I10     3. Controlled type 2 diabetes mellitus with hyperglycemia, without long-term current use of insulin (HCC)  E11.65     4. Pure hypercholesterolemia  E78.00        RECOMMENDATIONS: OLUFUNMILAYO FREIBERGER is a 82 y.o. African-American female whose past medical history and cardiac risk factors include: Hypertension, diabetes, hypercholesterolemia, anxiety.   Dyspnea on exertion Ongoing for the last 3 to 4 months but not progressive. Could be her anginal equivalent. EKG is nonischemic. Last nuclear stress test was in 2018 which was low risk. She has had multiple MPI studies in the past without any definitive diagnoses. Echo will be ordered to evaluate for structural heart disease and left ventricular systolic  function. Coronary calcium score to evaluate for CAC.  If she has severe coronary artery calcification would likely proceed forward with cardiac PET/CT otherwise coronary CTA could be considered.  Will reach out to her once the coronary calcium score is performed to discuss ischemic evaluation. Office blood pressures are well-controlled on current medical therapy. EKG notes an isolated PVC overall probability of a high PVC burden is low.  She does not have any palpitations.  Essential hypertension In the past blood pressures were not well-controlled. Now on current medical therapy Home blood pressures are also better controlled. Reemphasized importance of a low-salt diet.  Controlled type 2 diabetes mellitus with hyperglycemia, without long-term current use of insulin (HCC) Currently on ARB, statin therapy, metformin. Reemphasized importance of glycemic control.  Pure hypercholesterolemia Most recent lipids provided by PCP notes that the LDL levels are not well-controlled. Given the fact that she is diabetic I would recommend at least a goal LDL <70 mg/dL. Patient states that she has been having difficulty in the past with statin therapy due to myalgias. She does not know exactly which statin therapy she is taking she states that the medication name starts with the R.  However her medication list notes pravastatin. I have asked her to either bring her medication bottles or an accurate list for reconciliation purposes. She also has a PCP appointment October 2024 to have her lipids rechecked.  Data Reviewed: I have independently reviewed external notes provided by the referring provider as part of this office visit.   I have independently reviewed results of EKG, labs, prior cardiology notes, stress test results as part of medical decision making. I have ordered the following tests:  Orders Placed This Encounter  Procedures   CT CARDIAC SCORING (SELF PAY ONLY)    Standing Status:   Future     Standing Expiration Date:   05/20/2024    Order Specific Question:   Preferred imaging location?  Answer:   External   EKG 12-Lead   PCV ECHOCARDIOGRAM COMPLETE    Standing Status:   Future    Standing Expiration Date:   05/20/2024   I have not made  medications changes at today's encounter as noted above.  FINAL MEDICATION LIST END OF ENCOUNTER: No orders of the defined types were placed in this encounter.   Medications Discontinued During This Encounter  Medication Reason   aspirin EC 81 MG EC tablet    calcium carbonate (OS-CAL - DOSED IN MG OF ELEMENTAL CALCIUM) 1250 MG tablet    diazepam (VALIUM) 5 MG tablet    rosuvastatin (CRESTOR) 5 MG tablet    Olmesartan-Amlodipine-HCTZ 40-5-12.5 MG TABS      Current Outpatient Medications:    ALPRAZolam (XANAX) 0.25 MG tablet, Take 1 tablet (0.25 mg total) by mouth daily as needed for anxiety., Disp: 30 tablet, Rfl: 0   blood glucose meter kit and supplies KIT, Dispense based on patient and insurance preference. Use up to four times daily as directed. Dx code: E11.9, Disp: 1 each, Rfl: 0   ezetimibe (ZETIA) 10 MG tablet, Take 10 mg by mouth daily., Disp: , Rfl:    glucose blood (ONETOUCH VERIO) test strip, UAD for QID testing Dx: E11.9, Disp: 100 each, Rfl: 12   metFORMIN (GLUCOPHAGE) 1000 MG tablet, Take 1,000 mg by mouth 2 (two) times daily with a meal., Disp: , Rfl:    Multiple Vitamin (MULTIVITAMIN WITH MINERALS) TABS, Take 1 tablet by mouth daily., Disp: , Rfl:    Olmesartan-amLODIPine-HCTZ 40-10-25 MG TABS, Take 1 tablet by mouth daily., Disp: , Rfl:    ONETOUCH DELICA LANCETS FINE MISC, UAD for qid testing Dx: E11.9, Disp: 100 each, Rfl: 12   pravastatin (PRAVACHOL) 40 MG tablet, Take 0.5 tablets (20 mg total) by mouth daily., Disp: 30 tablet, Rfl: 1   vitamin E 400 UNIT capsule, Take 400 Units by mouth daily., Disp: , Rfl:    albuterol (PROVENTIL HFA;VENTOLIN HFA) 108 (90 Base) MCG/ACT inhaler, Inhale 1-2 puffs into the lungs  every 4 (four) hours as needed for wheezing or shortness of breath (or cough). (Patient not taking: Reported on 05/21/2023), Disp: 1 Inhaler, Rfl: 0  Orders Placed This Encounter  Procedures   CT CARDIAC SCORING (SELF PAY ONLY)   EKG 12-Lead   PCV ECHOCARDIOGRAM COMPLETE    There are no Patient Instructions on file for this visit.   --Continue cardiac medications as reconciled in final medication list. --Return in about 4 weeks (around 06/18/2023) for Follow up, Dyspnea. or sooner if needed. --Continue follow-up with your primary care physician regarding the management of your other chronic comorbid conditions.  Patient's questions and concerns were addressed to her satisfaction. She voices understanding of the instructions provided during this encounter.   This note was created using a voice recognition software as a result there may be grammatical errors inadvertently enclosed that do not reflect the nature of this encounter. Every attempt is made to correct such errors.  Tessa Lerner, [place], [facility]  Pager:  [phone] Office: [phone]

## 2023-05-29 ENCOUNTER — Other Ambulatory Visit: Payer: BC Managed Care – PPO

## 2023-06-05 ENCOUNTER — Ambulatory Visit: Payer: BC Managed Care – PPO

## 2023-06-05 DIAGNOSIS — R0609 Other forms of dyspnea: Secondary | ICD-10-CM

## 2023-06-16 NOTE — Progress Notes (Signed)
Please remind the patient to schedule her coronary calcium score as early as possible

## 2023-06-23 ENCOUNTER — Ambulatory Visit: Payer: BC Managed Care – PPO | Admitting: Cardiology

## 2023-07-08 ENCOUNTER — Telehealth: Payer: Self-pay | Admitting: Cardiology

## 2023-07-08 NOTE — Telephone Encounter (Signed)
-----   Message from Franchot Gallo sent at 06/26/2023  5:03 PM EDT ----- Regarding: Coronary Calcium Score CT Hello,  Dr. Odis Hollingshead would like patient to have coronary calcium score CT scan completed ASAP prior to next office visit on 07/09/23.  I called and left patient a message informing her of this. Please contact patient to schedule CT scan prior to 07/09/23.  Thank you, Danford Bad

## 2023-07-08 NOTE — Telephone Encounter (Signed)
Pt contacted 3x to schedule CT CAL

## 2023-07-09 ENCOUNTER — Ambulatory Visit: Payer: Medicare PPO | Attending: Cardiology | Admitting: Cardiology

## 2023-07-09 ENCOUNTER — Telehealth: Payer: Self-pay | Admitting: *Deleted

## 2023-07-09 ENCOUNTER — Encounter: Payer: Self-pay | Admitting: Cardiology

## 2023-07-09 VITALS — BP 130/58 | HR 87 | Resp 16 | Ht 64.0 in | Wt 197.2 lb

## 2023-07-09 DIAGNOSIS — R0609 Other forms of dyspnea: Secondary | ICD-10-CM | POA: Diagnosis not present

## 2023-07-09 DIAGNOSIS — E1165 Type 2 diabetes mellitus with hyperglycemia: Secondary | ICD-10-CM

## 2023-07-09 DIAGNOSIS — E7800 Pure hypercholesterolemia, unspecified: Secondary | ICD-10-CM | POA: Diagnosis not present

## 2023-07-09 DIAGNOSIS — I10 Essential (primary) hypertension: Secondary | ICD-10-CM | POA: Diagnosis not present

## 2023-07-09 NOTE — Telephone Encounter (Signed)
Message below sent by Dr. Odis Hollingshead via secure chat:  Could you please call her back. I saw her external labs from [facility] database her lipids are not at goal. Please make sure she follows up with PCP for lipid management.     Left the pt a message to call the office back to inquire the above information requested by Dr. Odis Hollingshead.    Will forward this message to our triage pool, to have when pt returns a callback to the office.

## 2023-07-09 NOTE — Patient Instructions (Signed)
Medication Instructions:   Your physician recommends that you continue on your current medications as directed. Please refer to the Current Medication list given to you today.  *If you need a refill on your cardiac medications before your next appointment, please call your pharmacy*    Follow-Up: At [facility], you and your health needs are our priority.  As part of our continuing mission to provide you with exceptional heart care, we have created designated Provider Care Teams.  These Care Teams include your primary Cardiologist (physician) and Advanced Practice Providers (APPs -  Physician Assistants and Nurse Practitioners) who all work together to provide you with the care you need, when you need it.  We recommend signing up for the patient portal called "MyChart".  Sign up information is provided on this After Visit Summary.  MyChart is used to connect with patients for Virtual Visits (Telemedicine).  Patients are able to view lab/test results, encounter notes, upcoming appointments, etc.  Non-urgent messages can be sent to your provider as well.   To learn more about what you can do with MyChart, go to [email or web address].    Your next appointment:   1 year(s)  Provider:   Tessa Lerner, DO     Other Instructions  PLEASE HAVE YOUR FOLLOW-UP PHYSICAL (WITH LABS) WITH YOUR PRIMARY CARE DOCTOR PRIOR TO YOUR ONE YEAR FOLLOW-UP APPOINTMENT WITH DR. Odis Hollingshead

## 2023-07-09 NOTE — Progress Notes (Signed)
Cardiology Office Note:  .   Date:  07/09/2023  ID:  ARYEL EDELEN, DOB December 31, 1940, MRN [id number] PCP:  Adrian Prince, MD  Former Cardiology Providers: Dr. Sharyn Lull, Ms Jacolyn Reedy  [facility] Providers Cardiologist:  Tessa Lerner, DO , [facility] (established care 05/21/2023) Electrophysiologist:  None  Click to update primary MD,subspecialty MD or APP then REFRESH:1}    History of Present Illness: .   [name] is a 82 y.o. African-American female whose past medical history and cardiovascular risk factors includes: Hypertension, diabetes, hypercholesterolemia, anxiety.   Patient was referred to the practice for evaluation of shortness of breath.  She is recommended to undergo echocardiogram and coronary calcification score for further risk stratification.  At the last visit the shared decision was to decide evaluation of CAD based on her coronary calcium score.  If the coronary calcium score was not severe coronary CTA would be recommended otherwise cardiac PET/CT.  However, patient did not undergo coronary calcium score for for reasons unknown.  States that her shortness of breath is now resolved.  She is attributing her dyspnea due to elevated blood pressure readings.  She denies anginal chest pain or heart failure symptoms.  Review of Systems: .   Review of Systems  Cardiovascular:  Negative for chest pain, claudication, dyspnea on exertion, irregular heartbeat, leg swelling, near-syncope, orthopnea, palpitations, paroxysmal nocturnal dyspnea and syncope.  Respiratory:  Negative for shortness of breath.   Hematologic/Lymphatic: Negative for bleeding problem.  Musculoskeletal:  Negative for muscle cramps and myalgias.  Neurological:  Negative for dizziness and light-headedness.    Studies Reviewed:   EKG: 05/21/2023: Sinus bradycardia, 59 bpm, occasional PVCs, without underlying ischemia injury pattern.   Echocardiogram: 06/05/2023: Normal LV systolic function  with visual EF 60-65%. Left ventricle cavity is normal in size. Normal global wall motion. Normal diastolic filling pattern, normal LAP. Mild left ventricular hypertrophy.  Left atrial cavity is normal in size. A lipomatous septum is present Aortic valve sclerosis without stenosis. Mild (Grade I) mitral regurgitation. Mild tricuspid regurgitation. No evidence of pulmonary hypertension. No prior studies for comparison.   Stress Testing: 05/2017 Nuclear stress EF: 82%. Blood pressure demonstrated a normal response to exercise. There was no ST segment deviation noted during stress. The study is normal. This is a low risk study. The left ventricular ejection fraction is normal (55-65%).  RADIOLOGY: NA  Risk Assessment/Calculations:   NA   Labs:       Latest Ref Rng & Units 04/28/2022    4:07 PM 04/14/2022   12:11 PM 05/07/2017   10:10 AM  CBC  WBC 4.0 - 10.5 K/uL 9.2  9.2  7.1   Hemoglobin 12.0 - 15.0 g/dL 47.4  25.9  56.3   Hematocrit 36.0 - 46.0 % 35.7  35.1  38.1   Platelets 150 - 400 K/uL 246  303  265.0        Latest Ref Rng & Units 04/28/2022    4:07 PM 04/14/2022   12:11 PM 05/07/2017   10:10 AM  BMP  Glucose 70 - 99 mg/dL 875  643  329   BUN 8 - 23 mg/dL 14  14  16    Creatinine 0.44 - 1.00 mg/dL 5.18  8.41  6.60   Sodium 135 - 145 mmol/L 139  139  140   Potassium 3.5 - 5.1 mmol/L 3.9  4.2  4.0   Chloride 98 - 111 mmol/L 107  103  104   CO2 22 - 32  mmol/L 25  28  29    Calcium 8.9 - 10.3 mg/dL 9.5  9.3  9.8       Latest Ref Rng & Units 04/28/2022    4:07 PM 04/14/2022   12:11 PM 05/07/2017   10:10 AM  CMP  Glucose 70 - 99 mg/dL 161  096  045   BUN 8 - 23 mg/dL 14  14  16    Creatinine 0.44 - 1.00 mg/dL 4.09  8.11  9.14   Sodium 135 - 145 mmol/L 139  139  140   Potassium 3.5 - 5.1 mmol/L 3.9  4.2  4.0   Chloride 98 - 111 mmol/L 107  103  104   CO2 22 - 32 mmol/L 25  28  29    Calcium 8.9 - 10.3 mg/dL 9.5  9.3  9.8   Total Protein 6.5 - 8.1 g/dL  7.3  7.1    Total Bilirubin 0.3 - 1.2 mg/dL  0.4  0.5   Alkaline Phos 38 - 126 U/L  64  43   AST 15 - 41 U/L  22  15   ALT 0 - 44 U/L  15  11     Lab Results  Component Value Date   CHOL 296 (H) 05/07/2017   HDL 48.50 05/07/2017   LDLCALC 229 (H) 05/07/2017   TRIG 94.0 05/07/2017   CHOLHDL 6 05/07/2017   No results for input(s): "LIPOA" in the last 8760 hours. No components found for: "NTPROBNP" No results for input(s): "PROBNP" in the last 8760 hours. No results for input(s): "TSH" in the last 8760 hours.  External Labs: Collected: March 17, 2023 per [facility] database A1c 6.6. BUN 20, creatinine 1.1. eGFR 48. Total cholesterol 235, triglycerides 90, HDL 42, LDL calculated 175. TSH 1.86  Physical Exam:    Today's Vitals   07/09/23 0806  BP: (!) 130/58  Pulse: 87  Resp: 16  SpO2: 93%  Weight: 197 lb 3.2 oz (89.4 kg)  Height: 5\' 4"  (1.626 m)   Body mass index is 33.85 kg/m. Wt Readings from Last 3 Encounters:  07/09/23 197 lb 3.2 oz (89.4 kg)  05/21/23 198 lb (89.8 kg)  02/15/23 200 lb (90.7 kg)    Physical Exam  Constitutional: No distress.  Age appropriate, hemodynamically stable.   Neck: No JVD present.  Cardiovascular: Normal rate, regular rhythm, S1 normal, S2 normal, intact distal pulses and normal pulses. Exam reveals no gallop, no S3 and no S4.  No murmur heard. Pulmonary/Chest: Effort normal and breath sounds normal. No stridor. She has no wheezes. She has no rales.  Abdominal: Soft. Bowel sounds are normal. She exhibits no distension. There is no abdominal tenderness.  Musculoskeletal:        General: No edema.     Cervical back: Neck supple.  Neurological: She is alert and oriented to person, place, and time. She has intact cranial nerves (2-12).  Skin: Skin is warm and moist.     Impression & Recommendation(s):  Impression:   ICD-10-CM   1. DOE (dyspnea on exertion)  R06.09     2. Essential hypertension  I10     3. Controlled type 2 diabetes mellitus with  hyperglycemia, without long-term current use of insulin (HCC)  E11.65     4. Pure hypercholesterolemia  E78.00        Recommendation(s):  DOE (dyspnea on exertion) Essentially resolved since last office visit. Prior EKG is nonischemic. Echocardiogram notes preserved LVEF see report for additional details At the  last office visit this shared decision was to proceed with coronary calcium score for further risk stratification.  If her coronary calcium score was severe would have recommended cardiac PET/CT otherwise coronary CTA.  However since her symptoms are resolved she would like to hold off on additional workup at this time  Essential hypertension Office blood pressure better controlled compared to the past. She still has not taken her medications she usually takes it around noon. Reemphasized importance of low-salt diet. Currently managed by primary care provider.  Controlled type 2 diabetes mellitus with hyperglycemia, without long-term current use of insulin (HCC) Currently on ARB, statin, metformin Currently managed by primary care provider.  Pure hypercholesterolemia LDL levels are not at goal. Recommended goal LDL <70 mg/dL. Advised her to follow-up with PCP for management of hyperlipidemia.  She is more than welcome to call us back if further assistance is needed.  Orders Placed:  No orders of the defined types were placed in this encounter.   As part of medical decision making results of the external labs from June 2024, echocardiogram, were reviewed independently at today's visit.   Final Medication List:   No orders of the defined types were placed in this encounter.   Medications Discontinued During This Encounter  Medication Reason   pravastatin (PRAVACHOL) 40 MG tablet    vitamin E 400 UNIT capsule      Current Outpatient Medications:    albuterol (PROVENTIL HFA;VENTOLIN HFA) 108 (90 Base) MCG/ACT inhaler, Inhale 1-2 puffs into the lungs every 4 (four) hours  as needed for wheezing or shortness of breath (or cough)., Disp: 1 Inhaler, Rfl: 0   ALPRAZolam (XANAX) 0.25 MG tablet, Take 1 tablet (0.25 mg total) by mouth daily as needed for anxiety., Disp: 30 tablet, Rfl: 0   blood glucose meter kit and supplies KIT, Dispense based on patient and insurance preference. Use up to four times daily as directed. Dx code: E11.9, Disp: 1 each, Rfl: 0   ezetimibe (ZETIA) 10 MG tablet, Take 10 mg by mouth daily., Disp: , Rfl:    glucose blood (ONETOUCH VERIO) test strip, UAD for QID testing Dx: E11.9, Disp: 100 each, Rfl: 12   metFORMIN (GLUCOPHAGE) 1000 MG tablet, Take 1,000 mg by mouth daily with breakfast., Disp: , Rfl:    Multiple Vitamin (MULTIVITAMIN WITH MINERALS) TABS, Take 1 tablet by mouth daily., Disp: , Rfl:    Olmesartan-amLODIPine-HCTZ 40-10-25 MG TABS, Take 1 tablet by mouth daily., Disp: , Rfl:    ONETOUCH DELICA LANCETS FINE MISC, UAD for qid testing Dx: E11.9, Disp: 100 each, Rfl: 12  Consent:      N/A  Disposition:   Return in about 1 year (around 07/08/2024) for Annual follow up visit, Dyspnea. or sooner if needed.  Her questions and concerns were addressed to her satisfaction. She voices understanding of the recommendations provided during this encounter.    Signed, Tessa Lerner, DO, [facility]  [facility]  [address] [place], [place] [address] [phone] 07/09/2023 11:54 AM

## 2023-07-17 NOTE — Telephone Encounter (Signed)
Left below information on pt's voicemail per [facility] permission.

## 2023-09-18 ENCOUNTER — Ambulatory Visit (HOSPITAL_COMMUNITY)
Admission: RE | Admit: 2023-09-18 | Discharge: 2023-09-18 | Disposition: A | Discharge status: Home / Self Care | Payer: Self-pay | Source: Ambulatory Visit | Attending: Cardiology | Admitting: Cardiology

## 2023-09-18 DIAGNOSIS — R0609 Other forms of dyspnea: Secondary | ICD-10-CM | POA: Insufficient documentation

## 2023-10-16 ENCOUNTER — Telehealth: Payer: Self-pay

## 2023-10-16 NOTE — Telephone Encounter (Signed)
Attempted to call pt on the listed home/mobile number and no answer. Attempted to call pt's daughter per DPR on file and unable to reach anyone. LVM stating that we were trying to get in touch with the pt and unable to get ahold of the pt with the number on file and needed to see if we had an updated number to reach her on.

## 2023-10-19 NOTE — Telephone Encounter (Signed)
Patient is returning call. Requesting call back. She states the number on chart is correct and you can reach her back at that number.

## 2023-10-19 NOTE — Telephone Encounter (Signed)
Attempted to call pt back but unable to reach pt over the phone. LVM stating to call back on direct pod line before end of day if possible to discuss recommendations from Dr. Odis Hollingshead.

## 2023-11-02 ENCOUNTER — Encounter: Payer: Self-pay | Admitting: Cardiology

## 2023-11-16 ENCOUNTER — Telehealth (HOSPITAL_BASED_OUTPATIENT_CLINIC_OR_DEPARTMENT_OTHER): Payer: Self-pay | Admitting: Cardiovascular Disease

## 2023-11-16 NOTE — Telephone Encounter (Signed)
Patient would like to switch from Dr. Odis Hollingshead to Dr. Duke Salvia

## 2023-11-17 NOTE — Telephone Encounter (Signed)
Okay.   Thanks for letting me know.   Zi Newbury [place], DO, [facility]

## 2023-11-23 NOTE — Telephone Encounter (Signed)
 Dr. Duke Salvia booked out until July - will call daughter, Chyrl Civatte, once July schedule opens up

## 2023-12-22 DIAGNOSIS — Z0000 Encounter for general adult medical examination without abnormal findings: Secondary | ICD-10-CM | POA: Diagnosis not present

## 2023-12-22 DIAGNOSIS — Z136 Encounter for screening for cardiovascular disorders: Secondary | ICD-10-CM | POA: Diagnosis not present

## 2023-12-22 DIAGNOSIS — E669 Obesity, unspecified: Secondary | ICD-10-CM | POA: Diagnosis not present

## 2023-12-22 DIAGNOSIS — E559 Vitamin D deficiency, unspecified: Secondary | ICD-10-CM | POA: Diagnosis not present

## 2023-12-22 DIAGNOSIS — D649 Anemia, unspecified: Secondary | ICD-10-CM | POA: Diagnosis not present

## 2023-12-22 DIAGNOSIS — Z0189 Encounter for other specified special examinations: Secondary | ICD-10-CM | POA: Diagnosis not present

## 2023-12-22 DIAGNOSIS — I10 Essential (primary) hypertension: Secondary | ICD-10-CM | POA: Diagnosis not present

## 2023-12-22 DIAGNOSIS — Z79899 Other long term (current) drug therapy: Secondary | ICD-10-CM | POA: Diagnosis not present

## 2023-12-22 DIAGNOSIS — E1122 Type 2 diabetes mellitus with diabetic chronic kidney disease: Secondary | ICD-10-CM | POA: Diagnosis not present

## 2023-12-22 DIAGNOSIS — N1831 Chronic kidney disease, stage 3a: Secondary | ICD-10-CM | POA: Diagnosis not present

## 2024-06-07 ENCOUNTER — Other Ambulatory Visit: Payer: Self-pay

## 2024-06-07 ENCOUNTER — Ambulatory Visit (INDEPENDENT_AMBULATORY_CARE_PROVIDER_SITE_OTHER): Admitting: Cardiovascular Disease

## 2024-06-07 ENCOUNTER — Telehealth (HOSPITAL_BASED_OUTPATIENT_CLINIC_OR_DEPARTMENT_OTHER): Payer: Self-pay | Admitting: *Deleted

## 2024-06-07 ENCOUNTER — Other Ambulatory Visit (HOSPITAL_COMMUNITY): Payer: Self-pay

## 2024-06-07 ENCOUNTER — Encounter (HOSPITAL_BASED_OUTPATIENT_CLINIC_OR_DEPARTMENT_OTHER): Payer: Self-pay | Admitting: Cardiovascular Disease

## 2024-06-07 VITALS — BP 146/72 | HR 66 | Wt 200.0 lb

## 2024-06-07 DIAGNOSIS — I10 Essential (primary) hypertension: Secondary | ICD-10-CM

## 2024-06-07 DIAGNOSIS — Z5181 Encounter for therapeutic drug level monitoring: Secondary | ICD-10-CM | POA: Diagnosis not present

## 2024-06-07 DIAGNOSIS — E782 Mixed hyperlipidemia: Secondary | ICD-10-CM | POA: Diagnosis not present

## 2024-06-07 DIAGNOSIS — Z789 Other specified health status: Secondary | ICD-10-CM | POA: Insufficient documentation

## 2024-06-07 DIAGNOSIS — I2510 Atherosclerotic heart disease of native coronary artery without angina pectoris: Secondary | ICD-10-CM | POA: Diagnosis not present

## 2024-06-07 MED ORDER — REPATHA SURECLICK 140 MG/ML ~~LOC~~ SOAJ: 140.0000 mg | SUBCUTANEOUS | 3 refills | Status: AC

## 2024-06-07 NOTE — Patient Instructions (Addendum)
 Medication Instructions:  START REPATHA  EVERY 2 WEEKS   START ASPIRIN  81 MG DAILY   *If you need a refill on your cardiac medications before your next appointment, please call your pharmacy*  Lab Work: FASTING LP/CMET IN 2 MONTHS   If you have labs (blood work) drawn today and your tests are completely normal, you will receive your results only by: MyChart Message (if you have MyChart) OR A paper copy in the mail If you have any lab test that is abnormal or we need to change your treatment, we will call you to review the results.  Testing/Procedures: NONE  Follow-Up: At [facility], you and your health needs are our priority.  As part of our continuing mission to provide you with exceptional heart care, our providers are all part of one team.  This team includes your primary Cardiologist (physician) and Advanced Practice Providers or APPs (Physician Assistants and Nurse Practitioners) who all work together to provide you with the care you need, when you need it.  Your next appointment:   6 month(s) WITH DR [place], CAITLIN W NP, OR MICHELLE S NP    We recommend signing up for the patient portal called MyChart.  Sign up information is provided on this After Visit Summary.  MyChart is used to connect with patients for Virtual Visits (Telemedicine).  Patients are able to view lab/test results, encounter notes, upcoming appointments, etc.  Non-urgent messages can be sent to your provider as well.   To learn more about what you can do with MyChart, go to [email or web address].   Other Instructions  Right Start Program at [facility]  Sessions include Structured exercise sessions 2 group sessions per week for 9 weeks Monitored by fitness app 20 to 40-minute sessions Post program complications such as a neck step.  It is free for [facility] Members or $99 for non-members. Financial assistance is available.  No referral required.  Please call [phone], visit [facility] in  person, or register online at [email or web address]  Subcutaneous Injection Instructions Using a Prefilled Syringe A subcutaneous injection is a shot of medicine that is given into the layer of fat and tissue between skin and muscle. The injection is given with a single-use syringe that is already filled with medicine. The syringe is called a prefilled syringe. Read the medicine guide or package insert that came with the syringe. Follow directions from the guide about how to prepare and give the injection. This is important because the directions may be different for each medicine. Use only the syringe, needle, and medicine that your health care provider prescribes. Use each prefilled syringe and needle only one time. Supplies needed: Prefilled syringe with needle. Use the needle length and size (gauge) that your provider or pharmacist gives to you. Alcohol wipes. Gauze. Bandage. A container to put used syringes. This may be a sharps container or a hard plastic container that has a secure lid, such as an empty laundry detergent bottle. How to choose a site for injection Follow instructions from your provider about where to give an injection. Do not inject in the same spot each time. There are five main areas that can be used for injecting. These areas include: Abdomen. Avoid the area that is within 2 inches (5 cm) of your navel (umbilicus). Front of thigh. Upper, outer side of thigh. Upper, outer side of arm. Upper, outer part of butt. How to give an injection using a prefilled syringe  Wash your hands with soap and water. If soap and  water are not available, use hand sanitizer. Use an alcohol wipe to clean the site where you will be injecting the needle. Let the site air-dry. Remove the plastic cover from the needle on the syringe. Do not let the needle touch anything. Hold the syringe with the needle pointing up. Check  the syringe for any remaining air bubbles. If there are air bubbles, flick the syringe with your finger until the air bubbles rise to the top. Then, gently push on the plunger until you can see a drop of medicine appear at the tip of the needle. This will clear any remaining air bubbles from the syringe. Hold the syringe in your writing hand like a pencil. Use your other hand to pinch and hold about an inch (2.5 cm) of skin. Do not directly touch the cleaned part of the skin. Gently but quickly, put the needle straight into the skin. The needle should be at a 90-degree angle to the skin. The needle may need to be injected at a 45-degree angle in thin adults or children who have a small amount of body fat. Follow instructions from your provider about the right size needle and angle you should use for the injection. After the needle is completely inserted into the skin, release the skin that you are pinching. Continue to hold the syringe with your writing hand. Use your thumb or index finger of your writing hand to push the plunger all the way into the syringe to inject the medicine. Pull the needle straight out of the skin. If there is bleeding: Press and hold a piece of gauze over the injection site until bleeding stops. Do not rub the area. Cover the injection site with a bandage, if needed. How to safely throw away the supplies If you are using a syringe that does not have a safety system for shielding the needle after injection: Do not recap the needle. Place the syringe and needle in the disposal container. If your syringe has a safety system for shielding the needle after injection: Firmly push down on the plunger after you complete the injection. The protective sleeve will automatically cover the needle, and you will hear a click. The click means that the needle is safely covered. Follow the disposal regulations for the area where you live. Do not use any syringe or needle more than one  time. Contact a health care provider if: You have trouble giving the injection. You think that the injection was not given correctly. You have trouble with any of the supplies. The medicine causes side effects. You get a rash on your skin. A get a fever. The condition that is being treated gets worse. Get help right away if: You get any of these symptoms after the injection is given: Trouble breathing. Chest pain. A rash over most or all of your body. Swelling of the lips or tongue. Trouble swallowing. These symptoms may be an emergency. Get help right away. Call 911. Do not wait to see if the symptoms will go away. Do not drive yourself to the hospital. This information is not intended to replace advice given to you by your health care provider. Make sure you discuss any questions you have with your health care provider. Document Revised: 07/03/2022 Document Reviewed: 07/03/2022 Elsevier Patient Education  2024 [employer].

## 2024-06-07 NOTE — Telephone Encounter (Signed)
 Patient seen by Dr Raford today, started on Repatha  Plus Dr Raford wanted her to start ASA 81 mg daily   Left message to call back to advise of ASA  Will forward to PA team to initiate PA on Repatha 

## 2024-06-07 NOTE — Telephone Encounter (Signed)
 Pharmacy Patient Advocate Encounter   Received notification from Physician's Office that prior authorization for REPATHA  is required/requested.   Insurance verification completed.   The patient is insured through [employer] .   Per test claim: PA required; PA submitted to above mentioned insurance via Latent Key/confirmation #/EOC [id number] Status is pending

## 2024-06-07 NOTE — Progress Notes (Signed)
 Cardiology Office Note:  .   Date:  06/07/2024  ID:  [name], DOB 1941/07/24, MRN [id number] PCP: Nichole Senior, MD   HeartCare Providers Cardiologist:  Madonna Large, DO    History of Present Illness: .   [name] is a 83 y.o. female with coronary calcification, hypertension, diabetes, and hyperlipidemia here for follow-up.  She saw Dr. Large 10/024 for evaluation of shortness of breath.  She was referred for an echocardiogram 05/2023 which revealed LVEF 60-65% with normal diastolic function and mild mitral regurgitation.  She was referred for coronary calcium  score but did not have it performed.  Nuclear stress testing in 2018 was negative.  At her visit 06/2023 her shortness of breath had resolved.  Discussed the use of AI scribe software for clinical note transcription with the patient, who gave verbal consent to proceed.  History of Present Illness [name] experiences significant discomfort due to arthritis, primarily affecting her left knee and right hand. Despite this, she maintains an active lifestyle, engaging in dance and Tai Chi exercises about five days a week. Her weight has increased by approximately seven pounds, currently around 190 pounds.  She has a history of breathing issues, which have since resolved. No current swelling in her legs or feet. Her blood pressure was elevated during the visit, attributed to stress from rushing to the appointment. She monitors her blood pressure at home and reports it is typically lower.  Today it was 118/79.  She is currently on olmesartan, amlodipine, and hydrochlorothiazide.  She has experienced muscle aches with previous cholesterol medications, including Lipitor and Zetia, leading her to discontinue them. She reports having had a coronary calcium  score test and recalls being told there was plaque present, but was not given specific details about the results.  She mentions a past COVID-19 infection, followed by  dizziness and muscle loss, which she attributes to sarcopenia. She wants to regain muscle strength, noting that her legs start to hurt after about 20 minutes of activity.  She is a Scientist, product/process development and is not interested in participating in programs at the [facility] due to religious reasons. She is open to exploring other exercise programs that accommodate her arthritis and help with muscle building.  ROS:  As per HPI  Studies Reviewed: .       Echocardiogram 06/05/2023:  Normal LV systolic function with visual EF 60-65%. Left ventricle cavity  is normal in size. Normal global wall motion. Normal diastolic filling  pattern, normal LAP. Mild left ventricular hypertrophy.  Left atrial cavity is normal in size. A lipomatous septum is present  Aortic valve sclerosis without stenosis.  Mild (Grade I) mitral regurgitation.  Mild tricuspid regurgitation. No evidence of pulmonary hypertension.  No prior studies for comparison.   EKG 99/25: Sinus rhtyhm.  Rate 66 bpm  Risk Assessment/Calculations:     HYPERTENSION CONTROL Vitals:   06/07/24 1325 06/07/24 1326  BP: (!) 140/70 (!) 146/72    The patient's blood pressure is elevated above target today.  In order to address the patient's elevated BP: Blood pressure will be monitored at home to determine if medication changes need to be made.         Physical Exam:   VS:  BP (!) 146/72   Pulse 66   Wt 200 lb (90.7 kg)   BMI 34.33 kg/m  , BMI Body mass index is 34.33 kg/m. GENERAL:  Well appearing HEENT: Pupils equal round and reactive, fundi not visualized, oral mucosa  unremarkable NECK:  No jugular venous distention, waveform within normal limits, carotid upstroke brisk and symmetric, no bruits, no thyromegaly LUNGS:  Clear to auscultation bilaterally HEART:  RRR.  PMI not displaced or sustained,S1 and S2 within normal limits, no S3, no S4, no clicks, no rubs, no murmurs ABD:  Flat, positive bowel sounds normal in frequency in pitch, no  bruits, no rebound, no guarding, no midline pulsatile mass, no hepatomegaly, no splenomegaly EXT:  2 plus pulses throughout, no edema, no cyanosis no clubbing SKIN:  No rashes no nodules NEURO:  Cranial nerves II through XII grossly intact, motor grossly intact throughout PSYCH:  Cognitively intact, oriented to person place and time   ASSESSMENT AND PLAN: .    Assessment & Plan # Coronary artery atherosclerosis with elevated coronary calcium  score Coronary artery atherosclerosis with a coronary calcium  score of 800, indicating significant plaque. Asymptomatic but requires aggressive risk factor management to prevent cardiovascular events. - Initiated Repatha  (evolocumab ) injections biweekly to lower LDL cholesterol and reduce plaque. Discussed potential injection site reactions and rare cold-like symptoms. - Encouraged increased cardiovascular exercise for heart health and weight management. - Set up for the Right Start program for exercise and muscle building. - Start aspirin  81mg  daily  # Mixed hyperlipidemia # Statin intolerance: Mixed hyperlipidemia with statin and Zetia intolerance due to muscle aches. Elevated coronary calcium  score necessitates alternative lipid-lowering strategies. Repatha  chosen for LDL clearance without muscle aches. - Initiated Repatha  (evolocumab ) injections as an alternative to statins and Zetia. - Monitor cholesterol levels in a few months to assess Repatha  effectiveness.  # Essential hypertension Blood pressure elevated at 146/68, likely stress-related. - Recheck blood pressure at home and continue current antihypertensive regimen. - Monitor home blood pressure readings to ensure control.  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 Muscle loss with exercise intolerance after 20 minutes. - Enroll in the Right Start program  for muscle building and exercise routine development.        Dispo: f/u 6 months  Signed, Annabella Scarce, MD

## 2024-06-09 ENCOUNTER — Other Ambulatory Visit (HOSPITAL_COMMUNITY): Payer: Self-pay

## 2024-06-09 NOTE — Telephone Encounter (Signed)
 Pharmacy Patient Advocate Encounter  Received notification from [employer] that Prior Authorization for REPATHA  has been APPROVED from 09/30/23 to 09/28/24. Ran test claim, Copay is $391 (FILL WILL MEET DEDUCTIBLE). This test claim was processed through [facility]- copay amounts may vary at other pharmacies due to pharmacy/plan contracts, or as the patient moves through the different stages of their insurance plan.

## 2024-06-09 NOTE — Telephone Encounter (Addendum)
 Called to make patient aware, she also needs to start ASA 81 mg daily   Left message to call back

## 2024-06-13 NOTE — Telephone Encounter (Signed)
 Pt's daughter is calling on behalf of pt. Pt's daughter states that they went to pharmacy yesterday to pick up medication but it was not filled because pharmacy has been calling to get in touch with us  to fill it. Not sure why as they sent an e-receipt on the 9th.

## 2024-06-13 NOTE — Telephone Encounter (Signed)
 Spoke with patient, advised of recommendations   Did speak with pharmacy and they were trying to run Repatha  through on the [employer] coverage and was saying needed PA   Will forward to PA team to see if PA can be done on the [place] plan and not have high deductible

## 2024-06-14 ENCOUNTER — Other Ambulatory Visit (HOSPITAL_COMMUNITY): Payer: Self-pay

## 2024-06-14 NOTE — Telephone Encounter (Signed)
 Advised pharmacy and patients daughter

## 2024-06-14 NOTE — Telephone Encounter (Signed)
 Pharmacy Patient Advocate Encounter   Received notification from Physician's Office that prior authorization for REPATHA  is required/requested.   Insurance verification completed.   The patient is insured through [facility] .   Per test claim: PA required; PA submitted to above mentioned insurance via Latent Key/confirmation #/[id number] Status is pending

## 2024-06-14 NOTE — Telephone Encounter (Signed)
 Pharmacy Patient Advocate Encounter  Received notification from [facility] that Prior Authorization for REPATHA  has been APPROVED from 06/14/24 to 06/14/25. Ran test claim, Copay is $47. This test claim was processed through [facility]- copay amounts may vary at other pharmacies due to pharmacy/plan contracts, or as the patient moves through the different stages of their insurance plan.

## 2024-09-27 LAB — COMPREHENSIVE METABOLIC PANEL WITH GFR
ALT: 11 IU/L (ref 0–32)
AST: 20 IU/L (ref 0–40)
Albumin: 4.1 g/dL (ref 3.7–4.7)
Alkaline Phosphatase: 63 IU/L (ref 48–129)
BUN/Creatinine Ratio: 22 (ref 12–28)
BUN: 27 mg/dL (ref 8–27)
Bilirubin Total: 0.3 mg/dL (ref 0.0–1.2)
CO2: 22 mmol/L (ref 20–29)
Calcium: 9.9 mg/dL (ref 8.7–10.3)
Chloride: 105 mmol/L (ref 96–106)
Creatinine, Ser: 1.21 mg/dL — ABNORMAL HIGH (ref 0.57–1.00)
Globulin, Total: 2.5 g/dL (ref 1.5–4.5)
Glucose: 101 mg/dL — ABNORMAL HIGH (ref 70–99)
Potassium: 4.6 mmol/L (ref 3.5–5.2)
Sodium: 141 mmol/L (ref 134–144)
Total Protein: 6.6 g/dL (ref 6.0–8.5)
eGFR: 45 mL/min/1.73 — ABNORMAL LOW

## 2024-09-27 LAB — LIPID PANEL
Chol/HDL Ratio: 3.1 ratio (ref 0.0–4.4)
Cholesterol, Total: 156 mg/dL (ref 100–199)
HDL: 50 mg/dL
LDL Chol Calc (NIH): 90 mg/dL (ref 0–99)
Triglycerides: 84 mg/dL (ref 0–149)
VLDL Cholesterol Cal: 16 mg/dL (ref 5–40)

## 2024-09-29 ENCOUNTER — Ambulatory Visit: Payer: Self-pay | Admitting: Cardiovascular Disease

## 2024-10-25 NOTE — Telephone Encounter (Signed)
 Pt returning call. Please advise.
